# Patient Record
Sex: Female | Born: 1950 | Race: Black or African American | Hispanic: No | Marital: Married | Smoking: Never smoker
Health system: Southern US, Community
[De-identification: ages and names within clinical notes are randomized; demographics above are authoritative.]

## PROBLEM LIST (undated history)

## (undated) DIAGNOSIS — K5790 Diverticulosis of intestine, part unspecified, without perforation or abscess without bleeding: Secondary | ICD-10-CM

## (undated) DIAGNOSIS — M109 Gout, unspecified: Secondary | ICD-10-CM

## (undated) DIAGNOSIS — E119 Type 2 diabetes mellitus without complications: Secondary | ICD-10-CM

## (undated) DIAGNOSIS — K219 Gastro-esophageal reflux disease without esophagitis: Secondary | ICD-10-CM

## (undated) DIAGNOSIS — G43909 Migraine, unspecified, not intractable, without status migrainosus: Secondary | ICD-10-CM

## (undated) DIAGNOSIS — E559 Vitamin D deficiency, unspecified: Secondary | ICD-10-CM

## (undated) DIAGNOSIS — E7800 Pure hypercholesterolemia, unspecified: Secondary | ICD-10-CM

## (undated) DIAGNOSIS — J40 Bronchitis, not specified as acute or chronic: Secondary | ICD-10-CM

## (undated) DIAGNOSIS — I10 Essential (primary) hypertension: Secondary | ICD-10-CM

## (undated) HISTORY — PX: TUBAL LIGATION: SHX77

## (undated) HISTORY — PX: TRIGGER FINGER RELEASE: SHX641

---

## 1979-12-09 HISTORY — PX: TUBAL LIGATION: SHX77

## 2001-12-08 HISTORY — PX: COLONOSCOPY W/ POLYPECTOMY: SHX1380

## 2004-10-16 ENCOUNTER — Ambulatory Visit: Payer: Self-pay | Admitting: Family Medicine

## 2006-01-21 ENCOUNTER — Ambulatory Visit: Payer: Self-pay | Admitting: Family Medicine

## 2006-05-13 ENCOUNTER — Ambulatory Visit: Payer: Self-pay | Admitting: Family Medicine

## 2008-01-05 ENCOUNTER — Ambulatory Visit: Payer: Self-pay | Admitting: Family Medicine

## 2009-03-14 ENCOUNTER — Ambulatory Visit: Payer: Self-pay | Admitting: Family Medicine

## 2010-05-29 ENCOUNTER — Ambulatory Visit: Payer: Self-pay | Admitting: Family Medicine

## 2010-12-04 ENCOUNTER — Ambulatory Visit: Payer: Self-pay | Admitting: Gastroenterology

## 2012-11-30 ENCOUNTER — Ambulatory Visit: Payer: Self-pay | Admitting: Family Medicine

## 2012-12-03 ENCOUNTER — Ambulatory Visit: Payer: Self-pay | Admitting: Family Medicine

## 2014-12-08 HISTORY — PX: CATARACT EXTRACTION, BILATERAL: SHX1313

## 2015-06-22 ENCOUNTER — Encounter: Admission: RE | Payer: Self-pay | Source: Ambulatory Visit

## 2015-06-22 ENCOUNTER — Ambulatory Visit: Admission: RE | Admit: 2015-06-22 | Payer: Self-pay | Source: Ambulatory Visit | Admitting: Gastroenterology

## 2015-06-22 SURGERY — COLONOSCOPY WITH PROPOFOL
Anesthesia: General

## 2015-08-03 ENCOUNTER — Ambulatory Visit: Admission: RE | Admit: 2015-08-03 | Payer: 59 | Source: Ambulatory Visit | Admitting: Gastroenterology

## 2015-08-03 ENCOUNTER — Encounter: Admission: RE | Payer: Self-pay | Source: Ambulatory Visit

## 2015-08-03 SURGERY — COLONOSCOPY WITH PROPOFOL
Anesthesia: General

## 2015-09-13 ENCOUNTER — Encounter: Admission: RE | Payer: Self-pay | Source: Ambulatory Visit

## 2015-09-13 ENCOUNTER — Ambulatory Visit: Admission: RE | Admit: 2015-09-13 | Payer: 59 | Source: Ambulatory Visit | Admitting: Gastroenterology

## 2015-09-13 SURGERY — COLONOSCOPY WITH PROPOFOL
Anesthesia: General

## 2016-01-02 ENCOUNTER — Other Ambulatory Visit: Payer: Self-pay | Admitting: Physician Assistant

## 2016-01-02 DIAGNOSIS — Z1239 Encounter for other screening for malignant neoplasm of breast: Secondary | ICD-10-CM

## 2016-01-16 ENCOUNTER — Ambulatory Visit: Payer: 59

## 2016-01-17 ENCOUNTER — Ambulatory Visit
Admission: RE | Admit: 2016-01-17 | Discharge: 2016-01-17 | Disposition: A | Discharge status: Home / Self Care | Payer: 59 | Source: Ambulatory Visit | Attending: Physician Assistant | Admitting: Physician Assistant

## 2016-01-17 DIAGNOSIS — Z1231 Encounter for screening mammogram for malignant neoplasm of breast: Secondary | ICD-10-CM | POA: Insufficient documentation

## 2016-01-17 DIAGNOSIS — Z1239 Encounter for other screening for malignant neoplasm of breast: Secondary | ICD-10-CM

## 2017-01-10 ENCOUNTER — Encounter: Payer: Self-pay | Admitting: Emergency Medicine

## 2017-01-10 ENCOUNTER — Emergency Department: Payer: Medicare Other

## 2017-01-10 ENCOUNTER — Emergency Department
Admission: EM | Admit: 2017-01-10 | Discharge: 2017-01-11 | Disposition: A | Discharge status: Home / Self Care | Payer: Medicare Other | Attending: Emergency Medicine | Admitting: Emergency Medicine

## 2017-01-10 DIAGNOSIS — R55 Syncope and collapse: Secondary | ICD-10-CM | POA: Diagnosis present

## 2017-01-10 DIAGNOSIS — Z79899 Other long term (current) drug therapy: Secondary | ICD-10-CM | POA: Diagnosis not present

## 2017-01-10 DIAGNOSIS — F329 Major depressive disorder, single episode, unspecified: Secondary | ICD-10-CM | POA: Insufficient documentation

## 2017-01-10 DIAGNOSIS — F32A Depression, unspecified: Secondary | ICD-10-CM

## 2017-01-10 HISTORY — DX: Type 2 diabetes mellitus without complications: E11.9

## 2017-01-10 LAB — BASIC METABOLIC PANEL
ANION GAP: 13 (ref 5–15)
BUN: 13 mg/dL (ref 6–20)
CALCIUM: 9.6 mg/dL (ref 8.9–10.3)
CO2: 26 mmol/L (ref 22–32)
CREATININE: 1 mg/dL (ref 0.44–1.00)
Chloride: 95 mmol/L — ABNORMAL LOW (ref 101–111)
GFR calc Af Amer: >60 mL/min (ref >60)
GFR calc non Af Amer: 58 mL/min — ABNORMAL LOW (ref >60)
Glucose, Bld: 453 mg/dL — ABNORMAL HIGH (ref 65–99)
Potassium: 3.7 mmol/L (ref 3.5–5.1)
SODIUM: 134 mmol/L — AB (ref 135–145)

## 2017-01-10 LAB — URINE DRUG SCREEN, QUALITATIVE (ARMC ONLY)
Amphetamines, Ur Screen: NOT DETECTED
BARBITURATES, UR SCREEN: NOT DETECTED
Benzodiazepine, Ur Scrn: NOT DETECTED
CANNABINOID 50 NG, UR ~~LOC~~: NOT DETECTED
Cocaine Metabolite,Ur ~~LOC~~: NOT DETECTED
MDMA (Ecstasy)Ur Screen: NOT DETECTED
Methadone Scn, Ur: NOT DETECTED
Opiate, Ur Screen: NOT DETECTED
PHENCYCLIDINE (PCP) UR S: NOT DETECTED
TRICYCLIC, UR SCREEN: NOT DETECTED

## 2017-01-10 LAB — GLUCOSE, CAPILLARY
GLUCOSE-CAPILLARY: 313 mg/dL — AB (ref 65–99)
Glucose-Capillary: 315 mg/dL — ABNORMAL HIGH (ref 65–99)

## 2017-01-10 LAB — ACETAMINOPHEN LEVEL

## 2017-01-10 LAB — URINALYSIS, COMPLETE (UACMP) WITH MICROSCOPIC
Bilirubin Urine: NEGATIVE
HGB URINE DIPSTICK: NEGATIVE
Ketones, ur: 5 mg/dL — AB
Leukocytes, UA: NEGATIVE
NITRITE: NEGATIVE
Protein, ur: NEGATIVE mg/dL
SPECIFIC GRAVITY, URINE: 1.027 (ref 1.005–1.030)
pH: 6 (ref 5.0–8.0)

## 2017-01-10 LAB — CBC WITH DIFFERENTIAL/PLATELET
BASOS ABS: 0.1 10*3/uL (ref 0–0.1)
Basophils Relative: 1 %
EOS ABS: 0.1 10*3/uL (ref 0–0.7)
Eosinophils Relative: 1 %
HEMATOCRIT: 43.8 % (ref 35.0–47.0)
Hemoglobin: 14.5 g/dL (ref 12.0–16.0)
LYMPHS ABS: 0.9 10*3/uL — AB (ref 1.0–3.6)
LYMPHS PCT: 17 %
MCH: 27.8 pg (ref 26.0–34.0)
MCHC: 33.2 g/dL (ref 32.0–36.0)
MCV: 83.9 fL (ref 80.0–100.0)
MONO ABS: 0.4 10*3/uL (ref 0.2–0.9)
Monocytes Relative: 7 %
NEUTROS ABS: 3.6 10*3/uL (ref 1.4–6.5)
Neutrophils Relative %: 74 %
PLATELETS: 200 10*3/uL (ref 150–440)
RBC: 5.22 MIL/uL — ABNORMAL HIGH (ref 3.80–5.20)
RDW: 13.6 % (ref 11.5–14.5)
WBC: 5.1 10*3/uL (ref 3.6–11.0)

## 2017-01-10 LAB — SALICYLATE LEVEL

## 2017-01-10 MED ORDER — SODIUM CHLORIDE 0.9 % IV BOLUS (SEPSIS)
1000.0000 mL | Freq: Once | INTRAVENOUS | Status: AC
  Administered 2017-01-10: 1000 mL via INTRAVENOUS

## 2017-01-10 MED ORDER — SODIUM CHLORIDE 0.9 % IV BOLUS (SEPSIS)
1000.0000 mL | Freq: Once | INTRAVENOUS | Status: AC
  Administered 2017-01-10: 1000 mL via INTRAVENOUS
  Filled 2017-01-10: qty 1000

## 2017-01-10 MED ORDER — LORAZEPAM 2 MG/ML IJ SOLN
1.0000 mg | Freq: Once | INTRAMUSCULAR | Status: AC
  Administered 2017-01-10: 1 mg via INTRAVENOUS
  Filled 2017-01-10: qty 1

## 2017-01-10 MED ORDER — LORAZEPAM 2 MG/ML IJ SOLN
1.0000 mg | Freq: Four times a day (QID) | INTRAMUSCULAR | Status: DC
  Administered 2017-01-10: 1 mg via INTRAVENOUS
  Filled 2017-01-10: qty 1

## 2017-01-10 NOTE — ED Notes (Signed)
IVF complete CBG 313. Derrill KayGoodman, Md informed new orders received.
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
## 2017-01-10 NOTE — ED Notes (Signed)
Pt is still unresponsive in room. Pt at times moving legs and head back and forth but not responding verbally or opening her eyes at this time. Pt's family has left for home and assured that they will be contacted with any changes in care.

## 2017-01-11 DIAGNOSIS — F329 Major depressive disorder, single episode, unspecified: Secondary | ICD-10-CM | POA: Diagnosis not present

## 2017-01-11 MED ORDER — SODIUM CHLORIDE 0.9 % IV SOLN
Freq: Once | INTRAVENOUS | Status: AC
  Administered 2017-01-11: 01:00:00 via INTRAVENOUS

## 2017-01-11 MED ORDER — ACETAMINOPHEN 325 MG PO TABS
650.0000 mg | ORAL_TABLET | Freq: Once | ORAL | Status: AC
  Administered 2017-01-11: 650 mg via ORAL
  Filled 2017-01-11: qty 2

## 2017-01-11 MED ORDER — SERTRALINE HCL 25 MG PO TABS: 25.0000 mg | ORAL_TABLET | Freq: Every day | ORAL | 0 refills | Status: DC

## 2017-01-11 MED ORDER — LORAZEPAM 2 MG/ML IJ SOLN: 1.0000 mg | Freq: Four times a day (QID) | INTRAMUSCULAR | Status: DC | PRN

## 2017-01-11 NOTE — ED Notes (Addendum)
Pt pressed call bell, this nurse entered room, pt groaning and rubbing LLQ with hand, pt asked what's wrong, pt not answering, pt asked again, no answer.  Pt told to look at this nurse and open her eyes, pt followed command, pt asked if she's in pain, pt nodded, when pt asked where she hurts, pt motions to LLQ with hand.  Pt asked if she needs to use bathroom, pt nods no.  Pt asked to characterize pain, pt answers verbally "sharp".  Dr. York CeriseForbach informed, no new orders at this time.

## 2017-01-11 NOTE — ED Provider Notes (Signed)
Patient awaiting specialist on-call consult. Patient was previously in what appeared to be catatonic state but appears to be improving.   Physical Exam  BP (!) 154/79   Pulse 71   Temp 98.2 F (36.8 C) (Oral)   Resp 16   Ht 5\' 6"  (1.676 m)   Wt 200 lb (90.7 kg)   SpO2 95%   BMI 32.28 kg/m  ----------------------------------------- 10:25 AM on 01/11/2017 -----------------------------------------   Physical Exam Patient awake and alert this time. Conversant with me. Denies any suicidal or homicidal ideation. ED Course  Procedures  MDM Patient seen and evaluated by specialist on call psychiatrist who is recommending Zoloft 25 mg daily. I spoke to the cardiologist regarding her QTC who said that is likely artificially prolonged because of her right bundle-branch block and that she should be safe with this medication. I discussed this plan with the patient and she is understanding when to comply. She will be referred to [facility] for further psychiatric consultation as well as with her primary care doctor.       Myrna Blazeravid Matthew Schaevitz, MD 01/11/17 1026

## 2017-01-11 NOTE — ED Notes (Signed)
AAOx3.  Skin warm and dry. D/C home with daughter in law

## 2017-01-11 NOTE — ED Notes (Signed)
[facility]  Psych consult now via telepsych.

## 2017-02-20 HISTORY — PX: COLONOSCOPY: SHX174

## 2017-12-08 DIAGNOSIS — S92402A Displaced unspecified fracture of left great toe, initial encounter for closed fracture: Secondary | ICD-10-CM

## 2017-12-08 HISTORY — DX: Displaced unspecified fracture of left great toe, initial encounter for closed fracture: S92.402A

## 2018-03-22 ENCOUNTER — Other Ambulatory Visit: Payer: Self-pay | Admitting: Physician Assistant

## 2018-03-22 DIAGNOSIS — Z1239 Encounter for other screening for malignant neoplasm of breast: Secondary | ICD-10-CM

## 2018-03-22 DIAGNOSIS — Z780 Asymptomatic menopausal state: Secondary | ICD-10-CM

## 2018-05-26 ENCOUNTER — Inpatient Hospital Stay: Admission: RE | Admit: 2018-05-26 | Payer: PRIVATE HEALTH INSURANCE | Source: Ambulatory Visit

## 2018-06-02 ENCOUNTER — Ambulatory Visit
Admission: RE | Admit: 2018-06-02 | Discharge: 2018-06-02 | Disposition: A | Discharge status: Home / Self Care | Payer: Medicare Other | Source: Ambulatory Visit | Attending: Physician Assistant | Admitting: Physician Assistant

## 2018-06-02 DIAGNOSIS — Z1239 Encounter for other screening for malignant neoplasm of breast: Secondary | ICD-10-CM | POA: Diagnosis present

## 2018-06-02 DIAGNOSIS — Z1231 Encounter for screening mammogram for malignant neoplasm of breast: Secondary | ICD-10-CM | POA: Diagnosis not present

## 2018-06-02 DIAGNOSIS — Z780 Asymptomatic menopausal state: Secondary | ICD-10-CM | POA: Diagnosis present

## 2018-08-14 ENCOUNTER — Emergency Department: Payer: Medicare Other

## 2018-08-14 ENCOUNTER — Encounter: Payer: Self-pay | Admitting: Emergency Medicine

## 2018-08-14 DIAGNOSIS — W108XXA Fall (on) (from) other stairs and steps, initial encounter: Secondary | ICD-10-CM | POA: Insufficient documentation

## 2018-08-14 DIAGNOSIS — E119 Type 2 diabetes mellitus without complications: Secondary | ICD-10-CM | POA: Diagnosis not present

## 2018-08-14 DIAGNOSIS — Z79899 Other long term (current) drug therapy: Secondary | ICD-10-CM | POA: Insufficient documentation

## 2018-08-14 DIAGNOSIS — S93492A Sprain of other ligament of left ankle, initial encounter: Secondary | ICD-10-CM | POA: Insufficient documentation

## 2018-08-14 DIAGNOSIS — Y999 Unspecified external cause status: Secondary | ICD-10-CM | POA: Diagnosis not present

## 2018-08-14 DIAGNOSIS — Z794 Long term (current) use of insulin: Secondary | ICD-10-CM | POA: Diagnosis not present

## 2018-08-14 DIAGNOSIS — S92415A Nondisplaced fracture of proximal phalanx of left great toe, initial encounter for closed fracture: Secondary | ICD-10-CM | POA: Diagnosis not present

## 2018-08-14 DIAGNOSIS — Y9389 Activity, other specified: Secondary | ICD-10-CM | POA: Insufficient documentation

## 2018-08-14 DIAGNOSIS — Y92511 Restaurant or cafe as the place of occurrence of the external cause: Secondary | ICD-10-CM | POA: Insufficient documentation

## 2018-08-14 DIAGNOSIS — S90932A Unspecified superficial injury of left great toe, initial encounter: Secondary | ICD-10-CM | POA: Diagnosis present

## 2018-08-14 NOTE — ED Notes (Signed)
Patient to waiting room via EMS after a fall.  Per EMS patient complains of left ankle pain.  VS:  HR 94; BP 189/91; RR 20, pulse oxi 97% on room air; cbg 512.
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
## 2018-08-15 ENCOUNTER — Emergency Department
Admission: EM | Admit: 2018-08-15 | Discharge: 2018-08-15 | Disposition: A | Discharge status: Home / Self Care | Payer: Medicare Other | Attending: Emergency Medicine | Admitting: Emergency Medicine

## 2018-08-15 DIAGNOSIS — S92415A Nondisplaced fracture of proximal phalanx of left great toe, initial encounter for closed fracture: Secondary | ICD-10-CM

## 2018-08-15 DIAGNOSIS — S93492A Sprain of other ligament of left ankle, initial encounter: Secondary | ICD-10-CM

## 2018-08-15 MED ORDER — IBUPROFEN 600 MG PO TABS
600.0000 mg | ORAL_TABLET | Freq: Once | ORAL | Status: AC
  Administered 2018-08-15: 600 mg via ORAL

## 2018-08-15 MED ORDER — IBUPROFEN 600 MG PO TABS
ORAL_TABLET | ORAL | Status: AC
  Filled 2018-08-15: qty 1

## 2018-08-15 MED ORDER — DICLOFENAC SODIUM 1 % TD GEL: 4.0000 g | Freq: Four times a day (QID) | TRANSDERMAL | 0 refills | Status: DC | PRN
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
## 2018-08-15 NOTE — Discharge Instructions (Addendum)
Please wear hard soled shoes at all times as well as ankle support to help with your broken toe as well as your sprained ankle.  Follow-up with the podiatrist within 1 week for recheck and return to the emergency department sooner for any concerns.  It was a pleasure to take care of you today, and thank you for coming to our emergency department.  If you have any questions or concerns before leaving please ask the nurse to grab me and I'm more than happy to go through your aftercare instructions again.  If you were prescribed any opioid pain medication today such as Norco, Vicodin, Percocet, morphine, hydrocodone, or oxycodone please make sure you do not drive when you are taking this medication as it can alter your ability to drive safely.  If you have any concerns once you are home that you are not improving or are in fact getting worse before you can make it to your follow-up appointment, please do not hesitate to call 911 and come back for further evaluation.  Merrily Brittle, MD  Results for orders placed or performed during the hospital encounter of 01/10/17  CBC with Differential  Result Value Ref Range   WBC 5.1 3.6 - 11.0 K/uL   RBC 5.22 (H) 3.80 - 5.20 MIL/uL   Hemoglobin 14.5 12.0 - 16.0 g/dL   HCT 16.1 09.6 - 04.5 %   MCV 83.9 80.0 - 100.0 fL   MCH 27.8 26.0 - 34.0 pg   MCHC 33.2 32.0 - 36.0 g/dL   RDW 40.9 81.1 - 91.4 %   Platelets 200 150 - 440 K/uL   Neutrophils Relative % 74 %   Lymphocytes Relative 17 %   Monocytes Relative 7 %   Eosinophils Relative 1 %   Basophils Relative 1 %   Neutro Abs 3.6 1.4 - 6.5 K/uL   Lymphs Abs 0.9 (L) 1.0 - 3.6 K/uL   Monocytes Absolute 0.4 0.2 - 0.9 K/uL   Eosinophils Absolute 0.1 0 - 0.7 K/uL   Basophils Absolute 0.1 0 - 0.1 K/uL  Basic metabolic panel  Result Value Ref Range   Sodium 134 (L) 135 - 145 mmol/L   Potassium 3.7 3.5 - 5.1 mmol/L   Chloride 95 (L) 101 - 111 mmol/L   CO2 26 22 - 32 mmol/L   Glucose, Bld 453 (H) 65 - 99  mg/dL   BUN 13 6 - 20 mg/dL   Creatinine, Ser 7.82 0.44 - 1.00 mg/dL   Calcium 9.6 8.9 - 95.6 mg/dL   GFR calc non Af Amer 58 (L) >60 mL/min   GFR calc Af Amer >60 >60 mL/min   Anion gap 13 5 - 15  Acetaminophen level  Result Value Ref Range   Acetaminophen (Tylenol), Serum <10 (L) 10 - 30 ug/mL  Salicylate level  Result Value Ref Range   Salicylate Lvl <7.0 2.8 - 30.0 mg/dL  Urine Drug Screen, Qualitative ([facility] only)  Result Value Ref Range   Tricyclic, Ur Screen NONE DETECTED NONE DETECTED   Amphetamines, Ur Screen NONE DETECTED NONE DETECTED   MDMA (Ecstasy)Ur Screen NONE DETECTED NONE DETECTED   Cocaine Metabolite,Ur [place] NONE DETECTED NONE DETECTED   Opiate, Ur Screen NONE DETECTED NONE DETECTED   Phencyclidine (PCP) Ur S NONE DETECTED NONE DETECTED   Cannabinoid 50 Ng, Ur [place] NONE DETECTED NONE DETECTED   Barbiturates, Ur Screen NONE DETECTED NONE DETECTED   Benzodiazepine, Ur Scrn NONE DETECTED NONE DETECTED   Methadone Scn, Ur NONE DETECTED  NONE DETECTED  Urinalysis, Complete w Microscopic  Result Value Ref Range   Color, Urine YELLOW (A) YELLOW   APPearance HAZY (A) CLEAR   Specific Gravity, Urine 1.027 1.005 - 1.030   pH 6.0 5.0 - 8.0   Glucose, UA >=500 (A) NEGATIVE mg/dL   Hgb urine dipstick NEGATIVE NEGATIVE   Bilirubin Urine NEGATIVE NEGATIVE   Ketones, ur 5 (A) NEGATIVE mg/dL   Protein, ur NEGATIVE NEGATIVE mg/dL   Nitrite NEGATIVE NEGATIVE   Leukocytes, UA NEGATIVE NEGATIVE   RBC / HPF 0-5 0 - 5 RBC/hpf   WBC, UA 0-5 0 - 5 WBC/hpf   Bacteria, UA FEW (A) NONE SEEN   Squamous Epithelial / LPF 0-5 (A) NONE SEEN   Mucus PRESENT   Glucose, capillary  Result Value Ref Range   Glucose-Capillary 313 (H) 65 - 99 mg/dL  Glucose, capillary  Result Value Ref Range   Glucose-Capillary 315 (H) 65 - 99 mg/dL   Dg Tibia/fibula Left  Result Date: 08/14/2018 CLINICAL DATA:  Pain after trip and fall injury today. EXAM: LEFT TIBIA AND FIBULA - 2 VIEW COMPARISON:  None.  FINDINGS: There is no evidence of fracture or other focal bone lesions. Soft tissues are unremarkable. IMPRESSION: Negative. Electronically Signed   By: Burman Nieves M.D.   On: 08/14/2018 23:48   Dg Foot Complete Left  Result Date: 08/14/2018 CLINICAL DATA:  Pain after trip and fall injury today. EXAM: LEFT FOOT - COMPLETE 3+ VIEW COMPARISON:  None. FINDINGS: Hallux valgus deformity with degenerative changes in the first metatarsal-phalangeal joint. Probably comminuted mostly transverse fracture of the midshaft of the first proximal phalanx. No articular involvement is suggested. No other fractures or dislocation identified. Soft tissues are unremarkable. 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 Comminuted fracture of the midshaft of the first proximal phalanx. Electronically Signed   By: Burman Nieves M.D.   On: 08/14/2018 23:49

## 2018-09-23 ENCOUNTER — Other Ambulatory Visit: Payer: Self-pay

## 2018-09-23 ENCOUNTER — Inpatient Hospital Stay
Admission: AD | Admit: 2018-09-23 | Discharge: 2018-09-25 | DRG: 256 | Disposition: A | Discharge status: Home / Self Care | Payer: Medicare Other | Source: Ambulatory Visit | Attending: Internal Medicine | Admitting: Internal Medicine

## 2018-09-23 DIAGNOSIS — Z791 Long term (current) use of non-steroidal anti-inflammatories (NSAID): Secondary | ICD-10-CM

## 2018-09-23 DIAGNOSIS — D6489 Other specified anemias: Secondary | ICD-10-CM | POA: Diagnosis present

## 2018-09-23 DIAGNOSIS — Z794 Long term (current) use of insulin: Secondary | ICD-10-CM | POA: Diagnosis not present

## 2018-09-23 DIAGNOSIS — L97529 Non-pressure chronic ulcer of other part of left foot with unspecified severity: Secondary | ICD-10-CM | POA: Diagnosis present

## 2018-09-23 DIAGNOSIS — K219 Gastro-esophageal reflux disease without esophagitis: Secondary | ICD-10-CM | POA: Diagnosis present

## 2018-09-23 DIAGNOSIS — Z79899 Other long term (current) drug therapy: Secondary | ICD-10-CM | POA: Diagnosis not present

## 2018-09-23 DIAGNOSIS — E11621 Type 2 diabetes mellitus with foot ulcer: Secondary | ICD-10-CM | POA: Diagnosis present

## 2018-09-23 DIAGNOSIS — Z7982 Long term (current) use of aspirin: Secondary | ICD-10-CM | POA: Diagnosis not present

## 2018-09-23 DIAGNOSIS — I10 Essential (primary) hypertension: Secondary | ICD-10-CM | POA: Diagnosis present

## 2018-09-23 DIAGNOSIS — I96 Gangrene, not elsewhere classified: Secondary | ICD-10-CM | POA: Diagnosis present

## 2018-09-23 DIAGNOSIS — E1140 Type 2 diabetes mellitus with diabetic neuropathy, unspecified: Secondary | ICD-10-CM | POA: Diagnosis present

## 2018-09-23 DIAGNOSIS — E1152 Type 2 diabetes mellitus with diabetic peripheral angiopathy with gangrene: Principal | ICD-10-CM | POA: Diagnosis present

## 2018-09-23 DIAGNOSIS — E10621 Type 1 diabetes mellitus with foot ulcer: Secondary | ICD-10-CM | POA: Diagnosis not present

## 2018-09-23 DIAGNOSIS — L97509 Non-pressure chronic ulcer of other part of unspecified foot with unspecified severity: Secondary | ICD-10-CM

## 2018-09-23 LAB — CBC
HEMATOCRIT: 40.3 % (ref 36.0–46.0)
HEMOGLOBIN: 12.7 g/dL (ref 12.0–15.0)
MCH: 26.8 pg (ref 26.0–34.0)
MCHC: 31.5 g/dL (ref 30.0–36.0)
MCV: 85 fL (ref 80.0–100.0)
Platelets: 319 10*3/uL (ref 150–400)
RBC: 4.74 MIL/uL (ref 3.87–5.11)
RDW: 13.3 % (ref 11.5–15.5)
WBC: 4.4 10*3/uL (ref 4.0–10.5)
nRBC: 0 % (ref 0.0–0.2)

## 2018-09-23 LAB — BASIC METABOLIC PANEL
Anion gap: 6 (ref 5–15)
BUN: 15 mg/dL (ref 8–23)
CALCIUM: 9.3 mg/dL (ref 8.9–10.3)
CO2: 28 mmol/L (ref 22–32)
CREATININE: 0.93 mg/dL (ref 0.44–1.00)
Chloride: 103 mmol/L (ref 98–111)
GFR calc Af Amer: >60 mL/min (ref >60)
Glucose, Bld: 114 mg/dL — ABNORMAL HIGH (ref 70–99)
POTASSIUM: 4.1 mmol/L (ref 3.5–5.1)
Sodium: 137 mmol/L (ref 135–145)

## 2018-09-23 LAB — GLUCOSE, CAPILLARY
GLUCOSE-CAPILLARY: 295 mg/dL — AB (ref 70–99)
Glucose-Capillary: 109 mg/dL — ABNORMAL HIGH (ref 70–99)
Glucose-Capillary: 159 mg/dL — ABNORMAL HIGH (ref 70–99)
Glucose-Capillary: 207 mg/dL — ABNORMAL HIGH (ref 70–99)

## 2018-09-23 LAB — PROTIME-INR
INR: 1.04
PROTHROMBIN TIME: 13.5 s (ref 11.4–15.2)

## 2018-09-23 LAB — SURGICAL PCR SCREEN
MRSA, PCR: NEGATIVE
Staphylococcus aureus: NEGATIVE

## 2018-09-23 MED ORDER — ATENOLOL 25 MG PO TABS
25.0000 mg | ORAL_TABLET | Freq: Every day | ORAL | Status: DC
  Administered 2018-09-23 – 2018-09-24 (×2): 25 mg via ORAL
  Filled 2018-09-23 (×2): qty 1

## 2018-09-23 MED ORDER — CHLORHEXIDINE GLUCONATE 4 % EX LIQD: 60.0000 mL | Freq: Once | CUTANEOUS | Status: DC

## 2018-09-23 MED ORDER — INSULIN ASPART 100 UNIT/ML ~~LOC~~ SOLN
0.0000 [IU] | Freq: Four times a day (QID) | SUBCUTANEOUS | Status: DC
  Administered 2018-09-24: 3 [IU] via SUBCUTANEOUS
  Administered 2018-09-24: 5 [IU] via SUBCUTANEOUS
  Administered 2018-09-24: 3 [IU] via SUBCUTANEOUS
  Administered 2018-09-25: 2 [IU] via SUBCUTANEOUS
  Administered 2018-09-25: 3 [IU] via SUBCUTANEOUS
  Administered 2018-09-25: 2 [IU] via SUBCUTANEOUS
  Filled 2018-09-23 (×7): qty 1

## 2018-09-23 MED ORDER — ATENOLOL 25 MG PO TABS: 25.0000 mg | ORAL_TABLET | Freq: Every day | ORAL | Status: DC

## 2018-09-23 MED ORDER — PANTOPRAZOLE SODIUM 40 MG PO TBEC
40.0000 mg | DELAYED_RELEASE_TABLET | Freq: Every day | ORAL | Status: DC
  Administered 2018-09-24 – 2018-09-25 (×2): 40 mg via ORAL
  Filled 2018-09-23 (×2): qty 1

## 2018-09-23 MED ORDER — ACETAMINOPHEN 650 MG RE SUPP: 650.0000 mg | Freq: Four times a day (QID) | RECTAL | Status: DC | PRN

## 2018-09-23 MED ORDER — CEFAZOLIN SODIUM-DEXTROSE 2-4 GM/100ML-% IV SOLN
2.0000 g | INTRAVENOUS | Status: AC
  Administered 2018-09-24: 2 g via INTRAVENOUS
  Filled 2018-09-23: qty 100

## 2018-09-23 MED ORDER — FERROUS SULFATE 325 (65 FE) MG PO TABS
325.0000 mg | ORAL_TABLET | Freq: Every day | ORAL | Status: DC
  Administered 2018-09-24 – 2018-09-25 (×2): 325 mg via ORAL
  Filled 2018-09-23 (×2): qty 1

## 2018-09-23 MED ORDER — AMLODIPINE BESYLATE 10 MG PO TABS
10.0000 mg | ORAL_TABLET | Freq: Every day | ORAL | Status: DC
  Administered 2018-09-23 – 2018-09-24 (×2): 10 mg via ORAL
  Filled 2018-09-23 (×2): qty 1

## 2018-09-23 MED ORDER — HYDROCHLOROTHIAZIDE 25 MG PO TABS: 25.0000 mg | ORAL_TABLET | Freq: Every day | ORAL | Status: DC

## 2018-09-23 MED ORDER — ATENOLOL 25 MG PO TABS
25.0000 mg | ORAL_TABLET | Freq: Every day | ORAL | Status: DC
Start: 2018-09-24 — End: 2018-09-23

## 2018-09-23 MED ORDER — SODIUM CHLORIDE 0.9 % IV SOLN
INTRAVENOUS | Status: DC
  Administered 2018-09-23 – 2018-09-24 (×3): via INTRAVENOUS

## 2018-09-23 MED ORDER — LOSARTAN POTASSIUM-HCTZ 100-25 MG PO TABS: 1.0000 | ORAL_TABLET | Freq: Every day | ORAL | Status: DC

## 2018-09-23 MED ORDER — ACETAMINOPHEN 325 MG PO TABS: 650.0000 mg | ORAL_TABLET | Freq: Four times a day (QID) | ORAL | Status: DC | PRN

## 2018-09-23 MED ORDER — LOSARTAN POTASSIUM 50 MG PO TABS
100.0000 mg | ORAL_TABLET | Freq: Every day | ORAL | Status: DC
  Administered 2018-09-23 – 2018-09-24 (×2): 100 mg via ORAL
  Filled 2018-09-23 (×2): qty 2

## 2018-09-23 MED ORDER — ENOXAPARIN SODIUM 40 MG/0.4ML ~~LOC~~ SOLN: 40.0000 mg | SUBCUTANEOUS | Status: DC

## 2018-09-23 MED ORDER — POLYETHYLENE GLYCOL 3350 17 G PO PACK: 17.0000 g | PACK | Freq: Every day | ORAL | Status: DC | PRN

## 2018-09-23 MED ORDER — SODIUM CHLORIDE 0.9 % IV SOLN: INTRAVENOUS | Status: DC

## 2018-09-23 MED ORDER — INSULIN GLARGINE 100 UNIT/ML ~~LOC~~ SOLN
55.0000 [IU] | Freq: Two times a day (BID) | SUBCUTANEOUS | Status: DC
  Administered 2018-09-23 – 2018-09-25 (×3): 55 [IU] via SUBCUTANEOUS
  Filled 2018-09-23 (×6): qty 0.55

## 2018-09-23 MED ORDER — HYDROCHLOROTHIAZIDE 25 MG PO TABS
25.0000 mg | ORAL_TABLET | Freq: Every day | ORAL | Status: DC
  Administered 2018-09-23 – 2018-09-25 (×2): 25 mg via ORAL
  Filled 2018-09-23 (×2): qty 1

## 2018-09-23 MED ORDER — TRAMADOL HCL 50 MG PO TABS: 50.0000 mg | ORAL_TABLET | Freq: Four times a day (QID) | ORAL | Status: DC | PRN

## 2018-09-23 MED ORDER — MUPIROCIN 2 % EX OINT
1.0000 "application " | TOPICAL_OINTMENT | Freq: Two times a day (BID) | CUTANEOUS | Status: DC
  Filled 2018-09-23: qty 22

## 2018-09-23 MED ORDER — SULFAMETHOXAZOLE-TRIMETHOPRIM 800-160 MG PO TABS
1.0000 | ORAL_TABLET | Freq: Two times a day (BID) | ORAL | Status: DC
  Administered 2018-09-23 – 2018-09-25 (×4): 1 via ORAL
  Filled 2018-09-23 (×4): qty 1

## 2018-09-23 MED ORDER — ENOXAPARIN SODIUM 40 MG/0.4ML ~~LOC~~ SOLN
40.0000 mg | SUBCUTANEOUS | Status: DC
  Administered 2018-09-24: 40 mg via SUBCUTANEOUS
  Filled 2018-09-23: qty 0.4

## 2018-09-23 MED ORDER — POVIDONE-IODINE 10 % EX SWAB: 2.0000 "application " | Freq: Once | CUTANEOUS | Status: DC

## 2018-09-23 MED ORDER — AMLODIPINE BESYLATE 10 MG PO TABS
10.0000 mg | ORAL_TABLET | Freq: Every day | ORAL | Status: DC
Start: 2018-09-24 — End: 2018-09-23

## 2018-09-23 MED ORDER — LOSARTAN POTASSIUM 50 MG PO TABS: 100.0000 mg | ORAL_TABLET | Freq: Every day | ORAL | Status: DC

## 2018-09-23 MED ORDER — AMLODIPINE BESYLATE 10 MG PO TABS: 10.0000 mg | ORAL_TABLET | Freq: Every day | ORAL | Status: DC

## 2018-09-23 NOTE — Anesthesia Preprocedure Evaluation (Addendum)
Anesthesia Evaluation  Patient identified by MRN, date of birth, ID band Patient awake    Reviewed: Allergy & Precautions, NPO status , Patient's Chart, lab work & pertinent test results, reviewed documented beta blocker date and time   History of Anesthesia Complications Negative for: history of anesthetic complications  Airway Mallampati: II  TM Distance: >3 FB Neck ROM: Full    Dental no notable dental hx.    Pulmonary neg pulmonary ROS, neg sleep apnea, neg COPD,    breath sounds clear to auscultation- rhonchi (-) wheezing      Cardiovascular Exercise Tolerance: Good hypertension, Pt. on medications and Pt. on home beta blockers (-) CAD, (-) Past MI, (-) Cardiac Stents and (-) CABG  Rhythm:Regular Rate:Normal - Systolic murmurs and - Diastolic murmurs    Neuro/Psych negative neurological ROS  negative psych ROS   GI/Hepatic Neg liver ROS, GERD  Medicated and Controlled,  Endo/Other  diabetes, Insulin Dependent  Renal/GU negative Renal ROS  negative genitourinary   Musculoskeletal negative musculoskeletal ROS (+)   Abdominal (+) + obese,   Peds negative pediatric ROS (+)  Hematology negative hematology ROS (+)   Anesthesia Other Findings Past Medical History: No date: Diabetes mellitus without complication (HCC)   Reproductive/Obstetrics                            Anesthesia Physical Anesthesia Plan  ASA: II  Anesthesia Plan: General   Post-op Pain Management:    Induction: Intravenous  PONV Risk Score and Plan: 2 and Propofol infusion  Airway Management Planned: Natural Airway  Additional Equipment:   Intra-op Plan:   Post-operative Plan:   Informed Consent: I have reviewed the patients History and Physical, chart, labs and discussed the procedure including the risks, benefits and alternatives for the proposed anesthesia with the patient or authorized representative who  has indicated his/her understanding and acceptance.   Dental advisory given  Plan Discussed with: CRNA and Anesthesiologist  Anesthesia Plan Comments:        Anesthesia Quick Evaluation

## 2018-09-23 NOTE — Progress Notes (Signed)
ORTHOPAEDIC CONSULTATION  REQUESTING PHYSICIAN: Enedina Finner, MD  Chief Complaint: Left second toe necrosis.  HPI: Carrie Burns is a 67 y.o. female who complains of open wound on the left second toe.  Patient sustained a fracture of her great toe and had been buddy taping the second and great toe together.  Developed a wound on the lateral aspect of the second toe that has become a full-thickness ulceration with exposure of the joint.  There has been drainage from the wound in the outpatient clinic.  Seen by Dr. Alberteen Spindle yesterday and had discussed the possibility of surgical intervention.  At this point she is admitted with necrosis of the left second toe with a history of diabetes.  She is scheduled for surgery tomorrow for amputation.  Past Medical History:  Diagnosis Date  . Diabetes mellitus without complication (HCC)    History reviewed. No pertinent surgical history. Social History   Socioeconomic History  . Marital status: Married    Spouse name: Not on file  . Number of children: Not on file  . Years of education: Not on file  . Highest education level: Not on file  Occupational History  . Not on file  Social Needs  . Financial resource strain: Not very hard  . Food insecurity:    Worry: Never true    Inability: Never true  . Transportation needs:    Medical: No    Non-medical: No  Tobacco Use  . Smoking status: Never Smoker  . Smokeless tobacco: Never Used  Substance and Sexual Activity  . Alcohol use: Never    Frequency: Never  . Drug use: Never  . Sexual activity: Not on file  Lifestyle  . Physical activity:    Days per week: 3 days    Minutes per session: 30 min  . Stress: Only a little  Relationships  . Social connections:    Talks on phone: More than three times a week    Gets together: More than three times a week    Attends religious service: 1 to 4 times per year    Active member of club or organization: Yes    Attends meetings of clubs or  organizations: 1 to 4 times per year    Relationship status: Not on file  Other Topics Concern  . Not on file  Social History Narrative  . Not on file   Family History  Problem Relation Age of Onset  . Breast cancer Neg Hx    No Known Allergies Prior to Admission medications   Medication Sig Start Date End Date Taking? Authorizing Provider  amLODipine (NORVASC) 10 MG tablet Take 10 mg by mouth daily.   Yes [provider]  aspirin EC 81 MG tablet Take 81 mg by mouth daily.   Yes [provider]  atenolol (TENORMIN) 25 MG tablet Take 25 mg by mouth daily.   Yes [provider]  diclofenac sodium (VOLTAREN) 1 % GEL Apply 4 g topically 4 (four) times daily as needed (pain). 08/15/18  Yes Merrily Brittle, MD  docusate sodium (COLACE) 100 MG capsule Take 100 mg by mouth 2 (two) times daily.   Yes [provider]  Exenatide ER (BYDUREON BCISE) 2 MG/0.85ML AUIJ Inject 2 mg into the skin once a week.    Yes [provider]  ferrous sulfate 325 (65 FE) MG tablet Take 325 mg by mouth daily.   Yes [provider]  glipiZIDE (GLUCOTROL) 5 MG tablet Take 5 mg  by mouth 2 (two) times daily before a meal.    Yes [provider]  hydrochlorothiazide (HYDRODIURIL) 50 MG tablet Take 50 mg by mouth daily.   Yes [provider]  ibuprofen (ADVIL,MOTRIN) 200 MG tablet Take 200 mg by mouth every 6 (six) hours as needed.   Yes [provider]  insulin glargine (LANTUS) 100 UNIT/ML injection Inject 55 Units into the skin 2 (two) times daily.   Yes [provider]  losartan-hydrochlorothiazide (HYZAAR) 100-25 MG tablet Take 1 tablet by mouth daily.   Yes [provider]  pantoprazole (PROTONIX) 40 MG tablet Take 40 mg by mouth daily.   Yes [provider]  sertraline (ZOLOFT) 25 MG tablet Take 1 tablet (25 mg total) by mouth daily. 01/11/17 01/11/18  Schaevitz, Myra Rude, MD  vitamin C (ASCORBIC ACID) 500 MG  tablet Take 500 mg by mouth daily.    [provider]   No results found.  Positive ROS: All other systems have been reviewed and were otherwise negative with the exception of those mentioned in the HPI and as above.  12 point ROS was performed.  Physical Exam: General: Alert and oriented.  No apparent distress.  Vascular:  Left foot:Dorsalis Pedis:  present Posterior Tibial:  diminished  Right foot: Dorsalis Pedis:  present Posterior Tibial:  diminished  Neuro:absent  Derm: Full-thickness necrotic wound on the lateral aspect of left second toe.  There is noted exposure of the PIPJ with a scant amount of serous and purulent drainage.  No proximal lymphangitic streaking.  Ortho/MS: Instability noted with range of motion at the PIPJ of the left second toe.  She does have some mild edema to the left ankle.  Assessment: Necrotic wound to joint with exposed joint left second toe Diabetes with neuropathy  Plan: We had a discussion in regards to outcomes for attempted salvage of this left second toe.  I discussed there is very minimal that can be done with the exposed joint and necrotic tissue on the lateral aspect of the second toe.  I do believe she has an excellent chance of recovery given her strong pulse with her dorsalis pedis and palpable posterior tibial pulse at this time.  I believe this was tissue necrosis from the pressure bandage that had been applied but with the amount of exposure of the joint and bone her best option is amputation.  After discussing with her and her family she has elected undergo amputation of the left second toe.  We will plan to perform this tomorrow.  Suspect amputation will be proximal at the level of the MTPJ.  Hopefully can primarily closed and likely discharge in 1 to 2 days after surgery.  Vascular surgery has been consulted and they noted if we have strong bleeding good and good perfusion likely no further intervention would be needed.  If the  perfusion is poor we can consider angios with possible intervention if needed.    Irean Hong, DPM Cell [phone]   09/23/2018 5:47 PM

## 2018-09-23 NOTE — Progress Notes (Signed)
Family Meeting Note  Advance Directive:yes Today a meeting took place with the pateint No family  Patient admitted for left second toe necrosis. Plans for amputation noted she is diabetic hypertensive. Otherwise independent at home. Code status discussed patient wants to be full code. Time spent during discussion 17 mins Carrie Finner, MD

## 2018-09-23 NOTE — Consult Note (Signed)
CH attempted to deliver HCPOA information ; Patient asleep CH will attempt later

## 2018-09-23 NOTE — H&P (Signed)
[place] [facility] [place] NAME: Carrie Burns    MR#:  [id number]  DATE OF BIRTH:  1951/01/03  DATE OF ADMISSION:  09/23/2018  PRIMARY CARE PHYSICIAN: Cristy Folks, PA-C   REQUESTING/REFERRING PHYSICIAN: Dr Cleda Mccreedy  CHIEF COMPLAINT:  left second toe infection/necrosis  HISTORY OF PRESENT ILLNESS:  Carrie Burns  is a 67 y.o. female with a known history of diabetes on insulin, hypertension is admitted from podiatry office with worsening left second toe infection/necrosis patient currently is on oral antibiotic with Bactrim which she is taking for last seven days. Dr. Cleda Mccreedy plans to do amputation of left second toe. Vascular consultation placed with Dr. Delana Meyer.  PAST MEDICAL HISTORY:   Past Medical History:  Diagnosis Date  . Diabetes mellitus without complication ([place])     PAST SURGICAL HISTOIRY:  History reviewed. No pertinent surgical history.  SOCIAL HISTORY:   Social History   Tobacco Use  . Smoking status: Never Smoker  . Smokeless tobacco: Never Used  Substance Use Topics  . Alcohol use: Never    Frequency: Never    FAMILY HISTORY:   Family History  Problem Relation Age of Onset  . Breast cancer Neg Hx     DRUG ALLERGIES:  No Known Allergies  REVIEW OF SYSTEMS:  Review of Systems  Constitutional: Negative for chills, fever and weight loss.  HENT: Negative for ear discharge, ear pain and nosebleeds.   Eyes: Negative for blurred vision, pain and discharge.  Respiratory: Negative for sputum production, shortness of breath, wheezing and stridor.   Cardiovascular: Negative for chest pain, palpitations, orthopnea and PND.  Gastrointestinal: Negative for abdominal pain, diarrhea, nausea and vomiting.  Genitourinary: Negative for frequency and urgency.  Musculoskeletal: Negative for back pain and joint pain.  Neurological: Negative for sensory change, speech change, focal weakness and weakness.   Psychiatric/Behavioral: Negative for depression and hallucinations. The patient is not nervous/anxious.      MEDICATIONS AT HOME:   Prior to Admission medications   Medication Sig Start Date End Date Taking? Authorizing Provider  amLODipine (NORVASC) 10 MG tablet Take 10 mg by mouth daily.    [provider]  atenolol (TENORMIN) 25 MG tablet Take 25 mg by mouth daily.    [provider]  diclofenac sodium (VOLTAREN) 1 % GEL Apply 4 g topically 4 (four) times daily as needed (pain). 08/15/18   Darel Hong, MD  docusate sodium (COLACE) 100 MG capsule Take 100 mg by mouth 2 (two) times daily.    [provider]  ferrous sulfate 325 (65 FE) MG tablet Take 325 mg by mouth daily.    [provider]  ibuprofen (ADVIL,MOTRIN) 200 MG tablet Take 200 mg by mouth every 6 (six) hours as needed.    [provider]  insulin glargine (LANTUS) 100 UNIT/ML injection Inject 55 Units into the skin 2 (two) times daily.    [provider]  losartan-hydrochlorothiazide (HYZAAR) 100-25 MG tablet Take 1 tablet by mouth daily.    [provider]  pantoprazole (PROTONIX) 40 MG tablet Take 40 mg by mouth daily.    [provider]  sertraline (ZOLOFT) 25 MG tablet Take 1 tablet (25 mg total) by mouth daily. 01/11/17 01/11/18  Schaevitz, Randall An, MD  vitamin C (ASCORBIC ACID) 500 MG tablet Take 500 mg by mouth daily.    [provider]      VITAL SIGNS:  Blood pressure 125/79, pulse 69, temperature 98.5  F (36.9 C), temperature source Oral, resp. rate 16, SpO2 100 %.  PHYSICAL EXAMINATION:  GENERAL:  67 y.o.-year-old patient lying in the bed with no acute distress.  EYES: Pupils equal, round, reactive to light and accommodation. No scleral icterus. Extraocular muscles intact.  HEENT: Head atraumatic, normocephalic. Oropharynx and nasopharynx clear.  NECK:  Supple, no jugular venous distention. No thyroid enlargement, no  tenderness.  LUNGS: Normal breath sounds bilaterally, no wheezing, rales,rhonchi or crepitation. No use of accessory muscles of respiration.  CARDIOVASCULAR: S1, S2 normal. No murmurs, rubs, or gallops.  ABDOMEN: Soft, nontender, nondistended. Bowel sounds present. No organomegaly or mass.  EXTREMITIES: left second toe necrosis. NEUROLOGIC: Cranial nerves II through XII are intact. Muscle strength 5/5 in all extremities. Sensation intact. Gait not checked.  PSYCHIATRIC: The patient is alert and oriented x 3.  SKIN: No obvious rash, lesion, or ulcer.   LABORATORY PANEL:   CBC No results for input(s): WBC, HGB, HCT, PLT in the last 168 hours. ------------------------------------------------------------------------------------------------------------------  Chemistries  No results for input(s): NA, K, CL, CO2, GLUCOSE, BUN, CREATININE, CALCIUM, MG, AST, ALT, ALKPHOS, BILITOT in the last 168 hours.  Invalid input(s): GFRCGP ------------------------------------------------------------------------------------------------------------------  Cardiac Enzymes No results for input(s): TROPONINI in the last 168 hours. ------------------------------------------------------------------------------------------------------------------  RADIOLOGY:  No results found.  EKG:    IMPRESSION AND PLAN:    Carrie Burns  is a 68 y.o. female with a known history of diabetes on insulin, hypertension is admitted from podiatry office with worsening left second toe infection/necrosis patient currently is on oral antibiotic with Bactrim which she is taking for last seven days.  1. left second toe necrosis/chronic infection -podiatry plans for amputation -NPO after midnight -vascular consultation placed with Dr. Delana Meyer -continue oral Bactrim -check CBC, met B, PT/INR  2. type II diabetes -continue home insulin regimen and sliding scale -patient takes oral glyburide  3. Hypertension continue  amlodipine, atenolol, losartan/hydrochlorothiazide 4. Chronic anemia continue iron supplements  All the records are reviewed and case discussed with ED provider. Management plans discussed with the patient, family and they are in agreement.  CODE STATUS: full  TOTAL TIME TAKING CARE OF THIS PATIENT: *50* minutes.    Fritzi Mandes M.D on 09/23/2018 at 1:32 PM  Between 7am to 6pm - Pager - [phone]  After 6pm go to [email or web address] - password [email or web address] [facility]  [facility]  Office  [phone]  CC: Primary care physician; Cristy Folks, PA-C

## 2018-09-23 NOTE — Progress Notes (Signed)
[facility]  Daily Progress Note   Asked to consult by: Dr. Ether Griffins  Patient with possible atherosclerotic disease with ulcer formation to the left foot (second toe).  Recommend: Moving forward with toe amputation / debridement if bleeding during procedure is noted to be poor will plan for left lower extremity angiogram on Monday. If bleeding is brisk will follow up as an outpatient.   Full consult to follow.   Cleda Daub PA-C 09/23/2018 5:42 PM

## 2018-09-24 ENCOUNTER — Inpatient Hospital Stay: Payer: Medicare Other | Admitting: Anesthesiology

## 2018-09-24 ENCOUNTER — Encounter
Admission: AD | Disposition: A | Discharge status: Home / Self Care | Payer: Self-pay | Source: Ambulatory Visit | Attending: Internal Medicine

## 2018-09-24 ENCOUNTER — Encounter: Payer: Self-pay | Admitting: *Deleted

## 2018-09-24 DIAGNOSIS — Z22322 Carrier or suspected carrier of Methicillin resistant Staphylococcus aureus: Secondary | ICD-10-CM

## 2018-09-24 HISTORY — PX: AMPUTATION TOE: SHX6595

## 2018-09-24 HISTORY — DX: Carrier or suspected carrier of methicillin resistant Staphylococcus aureus: Z22.322

## 2018-09-24 LAB — GLUCOSE, CAPILLARY
GLUCOSE-CAPILLARY: 233 mg/dL — AB (ref 70–99)
Glucose-Capillary: 265 mg/dL — ABNORMAL HIGH (ref 70–99)
Glucose-Capillary: 248 mg/dL — ABNORMAL HIGH (ref 70–99)
Glucose-Capillary: 251 mg/dL — ABNORMAL HIGH (ref 70–99)
Glucose-Capillary: 222 mg/dL — ABNORMAL HIGH (ref 70–99)

## 2018-09-24 LAB — HIV ANTIBODY (ROUTINE TESTING W REFLEX): HIV Screen 4th Generation wRfx: NONREACTIVE

## 2018-09-24 SURGERY — AMPUTATION, TOE
Anesthesia: General | Site: Foot | Laterality: Left

## 2018-09-24 MED ORDER — PROPOFOL 10 MG/ML IV BOLUS
INTRAVENOUS | Status: AC
  Filled 2018-09-24: qty 20

## 2018-09-24 MED ORDER — PHENYLEPHRINE HCL 10 MG/ML IJ SOLN
INTRAMUSCULAR | Status: DC | PRN
  Administered 2018-09-24: 50 ug via INTRAVENOUS

## 2018-09-24 MED ORDER — LIDOCAINE HCL (PF) 2 % IJ SOLN
INTRAMUSCULAR | Status: AC
  Filled 2018-09-24: qty 10

## 2018-09-24 MED ORDER — PROPOFOL 10 MG/ML IV BOLUS
INTRAVENOUS | Status: DC | PRN
  Administered 2018-09-24: 60 mg via INTRAVENOUS

## 2018-09-24 MED ORDER — LIDOCAINE HCL (PF) 1 % IJ SOLN
INTRAMUSCULAR | Status: DC | PRN
  Administered 2018-09-24: 5 mL

## 2018-09-24 MED ORDER — LIDOCAINE HCL (PF) 1 % IJ SOLN
INTRAMUSCULAR | Status: AC
  Filled 2018-09-24: qty 30

## 2018-09-24 MED ORDER — PROPOFOL 500 MG/50ML IV EMUL
INTRAVENOUS | Status: DC | PRN
  Administered 2018-09-24: 120 ug/kg/min via INTRAVENOUS

## 2018-09-24 MED ORDER — BUPIVACAINE HCL (PF) 0.5 % IJ SOLN
INTRAMUSCULAR | Status: AC
  Filled 2018-09-24: qty 30

## 2018-09-24 MED ORDER — BUPIVACAINE HCL 0.5 % IJ SOLN
INTRAMUSCULAR | Status: DC | PRN
  Administered 2018-09-24: 5 mL

## 2018-09-24 MED ORDER — FENTANYL CITRATE (PF) 100 MCG/2ML IJ SOLN
INTRAMUSCULAR | Status: AC
  Filled 2018-09-24: qty 2

## 2018-09-24 SURGICAL SUPPLY — 42 items
BANDAGE ELASTIC 4 LF NS (GAUZE/BANDAGES/DRESSINGS) ×2 IMPLANT
BLADE OSC/SAGITTAL MD 5.5X18 (BLADE) ×2 IMPLANT
BLADE SURG MINI STRL (BLADE) ×2 IMPLANT
BNDG CONFORM 3 STRL LF (GAUZE/BANDAGES/DRESSINGS) ×2 IMPLANT
BNDG ESMARK 4X12 TAN STRL LF (GAUZE/BANDAGES/DRESSINGS) IMPLANT
BNDG GAUZE 4.5X4.1 6PLY STRL (MISCELLANEOUS) ×2 IMPLANT
CANISTER SUCT 1200ML W/VALVE (MISCELLANEOUS) ×2 IMPLANT
COVER WAND RF STERILE (DRAPES) ×2 IMPLANT
DRAPE FLUOR MINI C-ARM 54X84 (DRAPES) IMPLANT
DRAPE XRAY CASSETTE 23X24 (DRAPES) IMPLANT
DURAPREP 26ML APPLICATOR (WOUND CARE) ×2 IMPLANT
ELECT REM PT RETURN 9FT ADLT (ELECTROSURGICAL) ×2
ELECTRODE REM PT RTRN 9FT ADLT (ELECTROSURGICAL) ×1 IMPLANT
GAUZE PACKING IODOFORM 1/2 (PACKING) IMPLANT
GAUZE PETRO XEROFOAM 1X8 (MISCELLANEOUS) ×2 IMPLANT
GAUZE SPONGE 4X4 12PLY STRL (GAUZE/BANDAGES/DRESSINGS) ×2 IMPLANT
GAUZE STRETCH 2X75IN STRL (MISCELLANEOUS) ×2 IMPLANT
GLOVE BIO SURGEON STRL SZ7.5 (GLOVE) ×2 IMPLANT
GLOVE INDICATOR 8.0 STRL GRN (GLOVE) ×2 IMPLANT
GOWN STRL REUS W/ TWL LRG LVL3 (GOWN DISPOSABLE) ×2 IMPLANT
GOWN STRL REUS W/TWL LRG LVL3 (GOWN DISPOSABLE) ×2
KIT TURNOVER KIT A (KITS) ×2 IMPLANT
LABEL OR SOLS (LABEL) ×2 IMPLANT
NEEDLE FILTER BLUNT 18X 1/2SAF (NEEDLE) ×1
NEEDLE FILTER BLUNT 18X1 1/2 (NEEDLE) ×1 IMPLANT
NEEDLE HYPO 25X1 1.5 SAFETY (NEEDLE) ×2 IMPLANT
NS IRRIG 500ML POUR BTL (IV SOLUTION) ×2 IMPLANT
PACK EXTREMITY ARMC (MISCELLANEOUS) ×2 IMPLANT
PAD ABD DERMACEA PRESS 5X9 (GAUZE/BANDAGES/DRESSINGS) ×4 IMPLANT
PULSAVAC PLUS IRRIG FAN TIP (DISPOSABLE) ×2
SHIELD FULL FACE ANTIFOG 7M (MISCELLANEOUS) IMPLANT
SOL .9 NS 3000ML IRR  AL (IV SOLUTION)
SOL .9 NS 3000ML IRR UROMATIC (IV SOLUTION) IMPLANT
STOCKINETTE M/LG 89821 (MISCELLANEOUS) ×2 IMPLANT
STRAP SAFETY 5IN WIDE (MISCELLANEOUS) IMPLANT
SUT ETHILON 3-0 FS-10 30 BLK (SUTURE) ×2
SUT ETHILON 5-0 FS-2 18 BLK (SUTURE) ×2 IMPLANT
SUT VIC AB 4-0 FS2 27 (SUTURE) IMPLANT
SUTURE EHLN 3-0 FS-10 30 BLK (SUTURE) ×1 IMPLANT
SWAB CULTURE AMIES ANAERIB BLU (MISCELLANEOUS) ×2 IMPLANT
SYR 10ML LL (SYRINGE) ×6 IMPLANT
TIP FAN IRRIG PULSAVAC PLUS (DISPOSABLE) ×1 IMPLANT

## 2018-09-24 NOTE — Progress Notes (Signed)
[facility] - [place] [facility]   PATIENT NAME: Carrie Burns    MR#:  [id number]  DATE OF BIRTH:  1951/01/02  SUBJECTIVE:  patient denies any complaints. Awaiting her planned second toe amputation today.  REVIEW OF SYSTEMS:   Review of Systems  Constitutional: Negative for chills, fever and weight loss.  HENT: Negative for ear discharge, ear pain and nosebleeds.   Eyes: Negative for blurred vision, pain and discharge.  Respiratory: Negative for sputum production, shortness of breath, wheezing and stridor.   Cardiovascular: Negative for chest pain, palpitations, orthopnea and PND.  Gastrointestinal: Negative for abdominal pain, diarrhea, nausea and vomiting.  Genitourinary: Negative for frequency and urgency.  Musculoskeletal: Negative for back pain and joint pain.  Neurological: Negative for sensory change, speech change, focal weakness and weakness.  Psychiatric/Behavioral: Negative for depression and hallucinations. The patient is not nervous/anxious.    Tolerating Diet:npo Tolerating PT: pending  DRUG ALLERGIES:  No Known Allergies  VITALS:  Blood pressure 110/66, pulse 62, temperature (!) 97.1 F (36.2 C), temperature source Tympanic, resp. rate 20, SpO2 99 %.  PHYSICAL EXAMINATION:   Physical Exam  GENERAL:  67 y.o.-year-old patient lying in the bed with no acute distress.  EYES: Pupils equal, round, reactive to light and accommodation. No scleral icterus. Extraocular muscles intact.  HEENT: Head atraumatic, normocephalic. Oropharynx and nasopharynx clear.  NECK:  Supple, no jugular venous distention. No thyroid enlargement, no tenderness.  LUNGS: Normal breath sounds bilaterally, no wheezing, rales, rhonchi. No use of accessory muscles of respiration.  CARDIOVASCULAR: S1, S2 normal. No murmurs, rubs, or gallops.  ABDOMEN: Soft, nontender, nondistended. Bowel sounds present. No organomegaly or mass.  EXTREMITIES: No cyanosis, clubbing or  edema b/l.   Dressing left foot NEUROLOGIC: Cranial nerves II through XII are intact. No focal Motor or sensory deficits b/l.   PSYCHIATRIC:  patient is alert and oriented x 3.  SKIN: No obvious rash, lesion, or ulcer.   LABORATORY PANEL:  CBC Recent Labs  Lab 09/23/18 1333  WBC 4.4  HGB 12.7  HCT 40.3  PLT 319    Chemistries  Recent Labs  Lab 09/23/18 1333  NA 137  K 4.1  CL 103  CO2 28  GLUCOSE 114*  BUN 15  CREATININE 0.93  CALCIUM 9.3   Cardiac Enzymes No results for input(s): TROPONINI in the last 168 hours. RADIOLOGY:  No results found. ASSESSMENT AND PLAN:   Carrie Burns  is a 67 y.o. female with a known history of diabetes on insulin, hypertension is admitted from podiatry office with worsening left second toe infection/necrosis patient currently is on oral antibiotic with Bactrim which she is taking for last seven days.  1. Left second toe necrosis/chronic infection -podiatry plans for amputation today -NPO after midnight -vascular consultation placed with Dr. Era Skeen for LE angiogram on monday depending on how her surgery goes today -continue oral Bactrim -labs look ok  2. type II diabetes -continue home insulin regimen and sliding scale -patient takes oral glyburide  3. Hypertension continue amlodipine, atenolol, losartan/hydrochlorothiazide  4. Chronic anemia continue iron supplements  PT to see after surgery  Case discussed with Care Management/Social Worker. Management plans discussed with the patient, family and they are in agreement.  CODE STATUS: full  DVT Prophylaxis: lovenox  TOTAL TIME TAKING CARE OF THIS PATIENT: *30* minutes.  >50% time spent on counselling and coordination of care  POSSIBLE D/C IN *1-2 DAYS, DEPENDING ON CLINICAL CONDITION.  Note: This dictation was  prepared with Dragon dictation along with smaller phrase technology. Any transcriptional errors that result from this process are  unintentional.  Enedina Finner M.D on 09/24/2018 at 10:30 AM  Between 7am to 6pm - Pager - [phone]  After 6pm go to [email or web address] - Social research officer, government  [facility]  Office  [phone]  CC: Primary care physician; Carrie Beath, PA-CPatient ID: Carrie Burns, female   DOB: Oct 26, 1951, 67 y.o.   MRN: [id number]

## 2018-09-24 NOTE — Anesthesia Procedure Notes (Signed)
Procedure Name: MAC Performed by: Cyndel Griffey, CRNA Pre-anesthesia Checklist: Patient identified, Emergency Drugs available, Suction available, Patient being monitored and Timeout performed Patient Re-evaluated:Patient Re-evaluated prior to induction Oxygen Delivery Method: Nasal cannula       

## 2018-09-24 NOTE — Progress Notes (Signed)
[facility] Daily Progress Note   Patient underwent amputation of second left toe today by podiatry. Patient was noted to have excellent bleeding and perfusion. At this time, there is no plan to move forward with any type of endovascular intervention.   Vascular surgery will sign off at this time.   Discussed with Dr. Wallis Mart Alene Bergerson PA-C 09/24/2018 12:32 PM

## 2018-09-24 NOTE — Anesthesia Post-op Follow-up Note (Signed)
Anesthesia QCDR form completed.        

## 2018-09-24 NOTE — Anesthesia Postprocedure Evaluation (Signed)
Anesthesia Post Note  Patient: Carrie Burns  Procedure(s) Performed: AMPUTATION SECOND TOE (Left Foot)  Patient location during evaluation: PACU Anesthesia Type: General Level of consciousness: awake and alert and oriented Pain management: pain level controlled Vital Signs Assessment: post-procedure vital signs reviewed and stable Respiratory status: spontaneous breathing, nonlabored ventilation and respiratory function stable Cardiovascular status: blood pressure returned to baseline and stable Postop Assessment: no signs of nausea or vomiting Anesthetic complications: no     Last Vitals:  Vitals:   09/24/18 1120 09/24/18 1245  BP: 121/74 116/61  Pulse: 61 63  Resp: 18 18  Temp: 36.7 C 36.6 C  SpO2: 99% 100%    Last Pain:  Vitals:   09/24/18 1245  TempSrc: Oral  PainSc:                  [name]

## 2018-09-24 NOTE — Transfer of Care (Signed)
Immediate Anesthesia Transfer of Care Note  Patient: Carrie Burns  Procedure(s) Performed: AMPUTATION SECOND TOE (Left Foot)  Patient Location: PACU  Anesthesia Type:MAC  Level of Consciousness: awake and responds to stimulation  Airway & Oxygen Therapy: Patient Spontanous Breathing and Patient connected to nasal cannula oxygen  Post-op Assessment: Report given to RN and Post -op Vital signs reviewed and stable  Post vital signs: Reviewed and stable  Last Vitals:  Vitals Value Taken Time  BP 96/63 09/24/2018 10:33 AM  Temp    Pulse 64 09/24/2018 10:33 AM  Resp 14 09/24/2018 10:33 AM  SpO2 96 % 09/24/2018 10:33 AM    Last Pain:  Vitals:   09/24/18 0925  TempSrc: Tympanic  PainSc:          Complications: No apparent anesthesia complications

## 2018-09-24 NOTE — Progress Notes (Signed)
Patient underwent amputation of left second toe today.  Patient tolerated the procedure and anesthesia quite well.  Excellent bleeding and perfusion of the skin flaps were noted.  No deep infection was noted.  I suspect she will be able to be discharged home postoperative day #1.  At this point excellent bleeding was noted and no further vascular work-up is warranted at this time.  We can always consult vascular in the outpatient setting.  We will follow-up tomorrow morning for dressing change with anticipation of discharge tomorrow.

## 2018-09-24 NOTE — Op Note (Signed)
Operative note   Surgeon:Emaad Nanna Armed forces logistics/support/administrative officer: None    Preop diagnosis: Necrotic left second toe    Postop diagnosis: Same    Procedure: Annotation left second toe MTPJ    EBL: Minimal    Anesthesia:local and IV sedation.  Local consisted of a one-to-one mixture of 1% lidocaine plain and 0.5% bupivacaine plain    Hemostasis: None    Specimen: Necrotic left second toe and deep wound culture    Complications: None    Operative indications:Carrie Burns is an 67 y.o. that presents today for surgical intervention.  The risks/benefits/alternatives/complications have been discussed and consent has been given.    Procedure:  Patient was brought into the OR and placed on the operating table in thesupine position. After anesthesia was obtained theleft lower extremity was prepped and draped in usual sterile fashion.  Attention was directed to the left second toe where distal to the MTPJ necrotic tissue was noted with exposure of the PIPJ.  Full-thickness incision was made from dorsal to plantar creating to medial and laterally based flaps.  The toe was disarticulated at the MTPJ and sent for pathological examination.  A deep wound culture was performed at this time.  The wound was flushed with 500 mL's of sterile saline.  Closure was performed after cauterization of bleeders.  Closure performed with a 5-0 nylon.  A bulky sterile dressing was applied.    Patient tolerated the procedure and anesthesia well.  Was transported from the OR to the PACU with all vital signs stable and vascular status intact. To be discharged per routine protocol.  Will follow up in approximately 1 week in the outpatient clinic.

## 2018-09-25 DIAGNOSIS — E10621 Type 1 diabetes mellitus with foot ulcer: Secondary | ICD-10-CM | POA: Diagnosis not present

## 2018-09-25 DIAGNOSIS — L97509 Non-pressure chronic ulcer of other part of unspecified foot with unspecified severity: Secondary | ICD-10-CM

## 2018-09-25 LAB — GLUCOSE, CAPILLARY
GLUCOSE-CAPILLARY: 206 mg/dL — AB (ref 70–99)
Glucose-Capillary: 172 mg/dL — ABNORMAL HIGH (ref 70–99)
Glucose-Capillary: 183 mg/dL — ABNORMAL HIGH (ref 70–99)

## 2018-09-25 MED ORDER — SULFAMETHOXAZOLE-TRIMETHOPRIM 800-160 MG PO TABS: 1.0000 | ORAL_TABLET | Freq: Two times a day (BID) | ORAL | 0 refills | Status: AC

## 2018-09-25 NOTE — Progress Notes (Signed)
Daily Progress Note   Subjective  - 1 Day Post-Op  F/u left 2nd toe amputation  Objective Vitals:   09/24/18 1953 09/24/18 2357 09/25/18 0427 09/25/18 0821  BP: 115/63 110/62 108/69 108/72  Pulse: 75 72 67 67  Resp: 18 19 18 18   Temp: 98.2 F (36.8 C) 98.7 F (37.1 C) 98.3 F (36.8 C) 98.2 F (36.8 C)  TempSrc: Oral Oral Oral Oral  SpO2: 97% 98% 96% 100%    Physical Exam: Skin flaps well perfused.  No s/s infection.  Laboratory CBC    Component Value Date/Time   WBC 4.4 09/23/2018 1333   HGB 12.7 09/23/2018 1333   HCT 40.3 09/23/2018 1333   PLT 319 09/23/2018 1333    BMET    Component Value Date/Time   NA 137 09/23/2018 1333   K 4.1 09/23/2018 1333   CL 103 09/23/2018 1333   CO2 28 09/23/2018 1333   GLUCOSE 114 (H) 09/23/2018 1333   BUN 15 09/23/2018 1333   CREATININE 0.93 09/23/2018 1333   CALCIUM 9.3 09/23/2018 1333   GFRNONAA >60 09/23/2018 1333   GFRAA >60 09/23/2018 1333    Assessment/Planning: Necrotic left 2nd toe s/p amputation.   OK from podiatry standpoint to d/c  Pt has po antibiotics at home and will continue to take.  WBAT in boot.  Should minimize weight bearing until wound has healed  F/u with me this week.  Pt to call for appt.  [name] A  09/25/2018, 9:31 AM

## 2018-09-25 NOTE — Discharge Instructions (Signed)
1. Weight bearing  Of left leg as tolerated, wear the boot 2. Podiatry f/u in 3-4 days

## 2018-09-25 NOTE — Discharge Summary (Signed)
[employer] [facility] [place] at [facility]   PATIENT NAME: Carrie Burns    MR#:  [id number]  DATE OF BIRTH:  [birth year]  DATE OF ADMISSION:  09/23/2018   ADMITTING PHYSICIAN: Linus Galas, DPM  DATE OF DISCHARGE: 09/25/2018  2:43 PM  PRIMARY CARE PHYSICIAN: Rosemarie Beath, PA-C   ADMISSION DIAGNOSIS:   ulcer lt foot  DISCHARGE DIAGNOSIS:   Active Problems:   Type 1 diabetes mellitus with foot ulcer (HCC)   SECONDARY DIAGNOSIS:   Past Medical History:  Diagnosis Date  . Diabetes mellitus without complication [facility])     HOSPITAL COURSE:   67 year old female with past medical history significant for insulin-dependent diabetes mellitus who had a left foot first toe fracture that was buddy taped with her second toe comes with second toe necrosis.  1.  Left foot second toe ulceration-full-thickness ulceration with exposure of joint seen.  She is admitted for necrosis of the second toe with history of diabetes. -Appreciate podiatry consult and vascular consult.  Patient did have good blood flow and brisk bleeding during surgery.  No angiogram or vascular procedures recommended during this admission. -Patient underwent amputation of the left second toe.  No infection seen. -Continue Bactrim at discharge.  Dressing is present and will be changed by podiatry in the office next week. -Weightbearing as tolerated in boot.  Pain is well controlled and patient is being discharged today  2.  Hypertension-on Norvasc, losartan, hydrochlorothiazide and atenolol  3.  Diabetes mellitus-on Lantus, glipizide and exenatide  4.  GERD-on PPI  Patient is ambulating well and will be discharged today   DISCHARGE CONDITIONS:   Guarded  CONSULTS OBTAINED:   Podiatry consult by Dr. [name]  DRUG ALLERGIES:   No Known Allergies DISCHARGE MEDICATIONS:   Allergies as of 09/25/2018   No Known Allergies     Medication List    STOP taking these  medications   hydrochlorothiazide 50 MG tablet Commonly known as:  HYDRODIURIL   metoprolol succinate 100 MG 24 hr tablet Commonly known as:  TOPROL-XL   sertraline 25 MG tablet Commonly known as:  ZOLOFT     TAKE these medications   amLODipine 10 MG tablet Commonly known as:  NORVASC Take 10 mg by mouth daily.   aspirin EC 81 MG tablet Take 81 mg by mouth daily.   atenolol 25 MG tablet Commonly known as:  TENORMIN Take 25 mg by mouth daily.   BYDUREON BCISE 2 MG/0.85ML Auij Generic drug:  Exenatide ER Inject 2 mg into the skin once a week.   diclofenac sodium 1 % Gel Commonly known as:  VOLTAREN Apply 4 g topically 4 (four) times daily as needed (pain).   docusate sodium 100 MG capsule Commonly known as:  COLACE Take 100 mg by mouth 2 (two) times daily.   ferrous sulfate 325 (65 FE) MG tablet Take 325 mg by mouth daily.   glipiZIDE 5 MG tablet Commonly known as:  GLUCOTROL Take 5 mg by mouth 2 (two) times daily before a meal.   ibuprofen 200 MG tablet Commonly known as:  ADVIL,MOTRIN Take 200 mg by mouth every 6 (six) hours as needed.   insulin glargine 100 UNIT/ML injection Commonly known as:  LANTUS Inject 55 Units into the skin 2 (two) times daily.   losartan-hydrochlorothiazide 100-25 MG tablet Commonly known as:  HYZAAR Take 1 tablet by mouth daily.   pantoprazole 40 MG tablet Commonly known as:  PROTONIX Take 40 mg by  mouth daily.   sulfamethoxazole-trimethoprim 800-160 MG tablet Commonly known as:  BACTRIM DS,SEPTRA DS Take 1 tablet by mouth every 12 (twelve) hours for 7 days.   vitamin C 500 MG tablet Commonly known as:  ASCORBIC ACID Take 500 mg by mouth daily.        DISCHARGE INSTRUCTIONS:   1. PCP f/u in 1-2 weeks 2. Podiatry f/u in 1 week  DIET:   Cardiac diet  ACTIVITY:   Activity as tolerated  OXYGEN:   Home Oxygen: No.  Oxygen Delivery: room air  DISCHARGE LOCATION:   home   If you experience worsening of  your admission symptoms, develop shortness of breath, life threatening emergency, suicidal or homicidal thoughts you must seek medical attention immediately by calling 911 or calling your MD immediately  if symptoms less severe.  You Must read complete instructions/literature along with all the possible adverse reactions/side effects for all the Medicines you take and that have been prescribed to you. Take any new Medicines after you have completely understood and accpet all the possible adverse reactions/side effects.   Please note  You were cared for by a hospitalist during your hospital stay. If you have any questions about your discharge medications or the care you received while you were in the hospital after you are discharged, you can call the unit and asked to speak with the hospitalist on call if the hospitalist that took care of you is not available. Once you are discharged, your primary care physician will handle any further medical issues. Please note that NO REFILLS for any discharge medications will be authorized once you are discharged, as it is imperative that you return to your primary care physician (or establish a relationship with a primary care physician if you do not have one) for your aftercare needs so that they can reassess your need for medications and monitor your lab values.    On the day of Discharge:  VITAL SIGNS:   Blood pressure 108/72, pulse 67, temperature 98.2 F (36.8 C), temperature source Oral, resp. rate 18, SpO2 100 %.  PHYSICAL EXAMINATION:    GENERAL:  67 y.o.-year-old patient lying in the bed with no acute distress.  EYES: Pupils equal, round, reactive to light and accommodation. No scleral icterus. Extraocular muscles intact.  HEENT: Head atraumatic, normocephalic. Oropharynx and nasopharynx clear.  NECK:  Supple, no jugular venous distention. No thyroid enlargement, no tenderness.  LUNGS: Normal breath sounds bilaterally, no wheezing, rales,rhonchi  or crepitation. No use of accessory muscles of respiration.  CARDIOVASCULAR: S1, S2 normal. No murmurs, rubs, or gallops.  ABDOMEN: Soft, non-tender, non-distended. Bowel sounds present. No organomegaly or mass.  EXTREMITIES: No pedal edema, cyanosis, or clubbing. Left foot dressing in place. S/p 2nd toe amputation NEUROLOGIC: Cranial nerves II through XII are intact. Muscle strength 5/5 in all extremities. Sensation intact. Gait not checked.  PSYCHIATRIC: The patient is alert and oriented x 3.  SKIN: No obvious rash, lesion, or ulcer.   DATA REVIEW:   CBC Recent Labs  Lab 09/23/18 1333  WBC 4.4  HGB 12.7  HCT 40.3  PLT 319    Chemistries  Recent Labs  Lab 09/23/18 1333  NA 137  K 4.1  CL 103  CO2 28  GLUCOSE 114*  BUN 15  CREATININE 0.93  CALCIUM 9.3     Microbiology Results  Results for orders placed or performed during the hospital encounter of 09/23/18  Surgical PCR screen     Status: None  Collection Time: 09/23/18  6:32 PM  Result Value Ref Range Status   MRSA, PCR NEGATIVE NEGATIVE Final   Staphylococcus aureus NEGATIVE NEGATIVE Final    Comment: (NOTE) The Xpert SA Assay (FDA approved for NASAL specimens in patients 71 years of age and older), is one component of a comprehensive surveillance program. It is not intended to diagnose infection nor to guide or monitor treatment. Performed at [facility], [address] [place], [place] [address]   Aerobic/Anaerobic Culture (surgical/deep wound)     Status: None (Preliminary result)   Collection Time: 09/24/18 10:11 AM  Result Value Ref Range Status   Specimen Description   Final    TOE Performed at [facility], [address], [place], [place] [address]    Special Requests   Final    LEFT,ABCESS Performed at [facility], [address] [place], [place] [address]    Gram Stain NO WBC SEEN NO ORGANISMS SEEN   Final   Culture   Final    NO GROWTH < 24  HOURS Performed at [facility], [address] [place], [place] [address]    Report Status PENDING  Incomplete    RADIOLOGY:  No results found.   Management plans discussed with the patient, family and they are in agreement.  CODE STATUS:     Code Status Orders  (From admission, onward)         Start     Ordered   09/23/18 1248  Full code  Continuous     09/23/18 1247        Code Status History    This patient has a current code status but no historical code status.      TOTAL TIME TAKING CARE OF THIS PATIENT: 38 minutes.    Enid Baas M.D on 09/25/2018 at 2:47 PM  Between 7am to 6pm - Pager - [phone]  After 6pm go to [email or web address] - password [email or web address] [facility]  [employer] [place] [facility]  Office  [phone]  CC: Primary care physician; Rosemarie Beath, PA-C   Note: This dictation was prepared with Dragon dictation along with smaller phrase technology. Any transcriptional errors that result from this process are unintentional.

## 2018-09-28 LAB — SURGICAL PATHOLOGY

## 2018-09-30 LAB — AEROBIC/ANAEROBIC CULTURE W GRAM STAIN (SURGICAL/DEEP WOUND): Gram Stain: NONE SEEN

## 2018-10-01 ENCOUNTER — Telehealth: Payer: Self-pay | Admitting: Licensed Clinical Social Worker

## 2018-10-01 NOTE — Telephone Encounter (Signed)
EMMI flagged patient for answering yes to loss of interest in things. Clinical Child psychotherapist (CSW) contacted patient via telephone. Per patient she is doing good and her foot is healing well. Per patient she is going to Dr. Irene Limbo office Monday to get her staples removed. Patient reported that she does not have any symptoms of depression or anxiety. Patient scored 0 on PHQ-9. Patient reported no needs or concerns.   [employer], LCSW [phone]

## 2020-01-03 ENCOUNTER — Other Ambulatory Visit: Payer: Self-pay | Admitting: Family Medicine

## 2020-01-03 DIAGNOSIS — Z1231 Encounter for screening mammogram for malignant neoplasm of breast: Secondary | ICD-10-CM

## 2020-03-30 ENCOUNTER — Ambulatory Visit: Payer: Self-pay | Attending: Internal Medicine

## 2020-03-30 DIAGNOSIS — Z23 Encounter for immunization: Secondary | ICD-10-CM

## 2020-03-30 NOTE — Progress Notes (Signed)
   Covid-19 Vaccination Clinic  Name:  Lusero Nordlund    MRN: [id number] DOB: 08/12/1954  03/30/2020  Ms. Gravatt was observed post Covid-19 immunization for 15 minutes without incident. She was provided with Vaccine Information Sheet and instruction to access the V-Safe system.   Ms. Luepke was instructed to call 911 with any severe reactions post vaccine: Marland Kitchen Difficulty breathing  . Swelling of face and throat  . A fast heartbeat  . A bad rash all over body  . Dizziness and weakness   Immunizations Administered    Name Date Dose VIS Date Route   Pfizer COVID-19 Vaccine 03/30/2020 11:33 AM 0.3 mL 02/01/2019 Intramuscular   Manufacturer: [employer], [employer]   Lot: [id number]   NDC: [address]-0

## 2020-04-24 ENCOUNTER — Ambulatory Visit: Payer: Medicare Other | Attending: Internal Medicine

## 2020-04-24 DIAGNOSIS — Z23 Encounter for immunization: Secondary | ICD-10-CM

## 2020-04-24 NOTE — Progress Notes (Signed)
   Covid-19 Vaccination Clinic  Name:  STINA GANE    MRN: [id number] DOB: Sep 01, 1951  04/24/2020  Ms. Germer was observed post Covid-19 immunization for 15 minutes without incident. She was provided with Vaccine Information Sheet and instruction to access the V-Safe system.   Ms. Mirkin was instructed to call 911 with any severe reactions post vaccine: Marland Kitchen Difficulty breathing  . Swelling of face and throat  . A fast heartbeat  . A bad rash all over body  . Dizziness and weakness   Immunizations Administered    Name Date Dose VIS Date Route   Pfizer COVID-19 Vaccine 04/24/2020 11:11 AM 0.3 mL 02/01/2019 Intramuscular   Manufacturer: [employer], [employer]   Lot: [id number]   NDC: [address]-9

## 2020-07-20 ENCOUNTER — Other Ambulatory Visit: Payer: Self-pay | Admitting: Family Medicine

## 2020-07-20 DIAGNOSIS — M7989 Other specified soft tissue disorders: Secondary | ICD-10-CM

## 2020-07-20 DIAGNOSIS — E1159 Type 2 diabetes mellitus with other circulatory complications: Secondary | ICD-10-CM

## 2020-09-20 ENCOUNTER — Other Ambulatory Visit: Payer: Self-pay | Admitting: Family Medicine

## 2020-09-20 DIAGNOSIS — Z8701 Personal history of pneumonia (recurrent): Secondary | ICD-10-CM

## 2021-06-26 ENCOUNTER — Other Ambulatory Visit (INDEPENDENT_AMBULATORY_CARE_PROVIDER_SITE_OTHER): Payer: Self-pay | Admitting: Podiatry

## 2021-06-26 ENCOUNTER — Other Ambulatory Visit: Payer: Self-pay | Admitting: Podiatry

## 2021-06-26 DIAGNOSIS — E1142 Type 2 diabetes mellitus with diabetic polyneuropathy: Secondary | ICD-10-CM

## 2021-06-26 DIAGNOSIS — M79671 Pain in right foot: Secondary | ICD-10-CM

## 2021-06-27 ENCOUNTER — Ambulatory Visit (INDEPENDENT_AMBULATORY_CARE_PROVIDER_SITE_OTHER): Payer: Medicare Other

## 2021-06-27 ENCOUNTER — Other Ambulatory Visit: Payer: Self-pay

## 2021-06-27 DIAGNOSIS — M79671 Pain in right foot: Secondary | ICD-10-CM

## 2021-06-27 DIAGNOSIS — E1142 Type 2 diabetes mellitus with diabetic polyneuropathy: Secondary | ICD-10-CM | POA: Diagnosis not present

## 2021-07-02 ENCOUNTER — Other Ambulatory Visit: Payer: Self-pay

## 2021-07-02 ENCOUNTER — Encounter
Admission: RE | Admit: 2021-07-02 | Discharge: 2021-07-02 | Disposition: A | Discharge status: Home / Self Care | Payer: Medicare Other | Source: Ambulatory Visit | Attending: Podiatry | Admitting: Podiatry

## 2021-07-02 ENCOUNTER — Encounter: Payer: Self-pay | Admitting: Urgent Care

## 2021-07-02 DIAGNOSIS — Z01818 Encounter for other preprocedural examination: Secondary | ICD-10-CM | POA: Insufficient documentation

## 2021-07-02 DIAGNOSIS — E118 Type 2 diabetes mellitus with unspecified complications: Secondary | ICD-10-CM | POA: Insufficient documentation

## 2021-07-02 DIAGNOSIS — I10 Essential (primary) hypertension: Secondary | ICD-10-CM | POA: Diagnosis not present

## 2021-07-02 HISTORY — DX: Vitamin D deficiency, unspecified: E55.9

## 2021-07-02 HISTORY — DX: Essential (primary) hypertension: I10

## 2021-07-02 HISTORY — DX: Gastro-esophageal reflux disease without esophagitis: K21.9

## 2021-07-02 HISTORY — DX: Diverticulosis of intestine, part unspecified, without perforation or abscess without bleeding: K57.90

## 2021-07-02 HISTORY — DX: Bronchitis, not specified as acute or chronic: J40

## 2021-07-02 HISTORY — DX: Gout, unspecified: M10.9

## 2021-07-02 HISTORY — DX: Pure hypercholesterolemia, unspecified: E78.00

## 2021-07-02 HISTORY — DX: Migraine, unspecified, not intractable, without status migrainosus: G43.909

## 2021-07-02 LAB — CBC
HCT: 35.7 % — ABNORMAL LOW (ref 36.0–46.0)
Hemoglobin: 11.8 g/dL — ABNORMAL LOW (ref 12.0–15.0)
MCH: 28.2 pg (ref 26.0–34.0)
MCHC: 33.1 g/dL (ref 30.0–36.0)
MCV: 85.2 fL (ref 80.0–100.0)
Platelets: 190 10*3/uL (ref 150–400)
RBC: 4.19 MIL/uL (ref 3.87–5.11)
RDW: 13.6 % (ref 11.5–15.5)
WBC: 3 10*3/uL — ABNORMAL LOW (ref 4.0–10.5)
nRBC: 0 % (ref 0.0–0.2)

## 2021-07-02 LAB — BASIC METABOLIC PANEL
Anion gap: 10 (ref 5–15)
BUN: 20 mg/dL (ref 8–23)
CO2: 26 mmol/L (ref 22–32)
Calcium: 9.3 mg/dL (ref 8.9–10.3)
Chloride: 104 mmol/L (ref 98–111)
Creatinine, Ser: 0.87 mg/dL (ref 0.44–1.00)
GFR, Estimated: >60 mL/min (ref >60)
Glucose, Bld: 191 mg/dL — ABNORMAL HIGH (ref 70–99)
Potassium: 3.2 mmol/L — ABNORMAL LOW (ref 3.5–5.1)
Sodium: 140 mmol/L (ref 135–145)

## 2021-07-02 NOTE — Patient Instructions (Addendum)
Your procedure is scheduled on: Friday, July 29 Report to the Registration Desk on the 1st floor of the [facility] To find out your arrival time, please call [phone] between 1PM - 3PM on: Thursday, July 28  REMEMBER: Instructions that are not followed completely may result in serious medical risk, up to and including death; or upon the discretion of your surgeon and anesthesiologist your surgery may need to be rescheduled.  Do not eat food after midnight the night before surgery.  No gum chewing, lozengers or hard candies.  You may however, drink water up to 2 hours before you are scheduled to arrive for your surgery. Do not drink anything within 2 hours of your scheduled arrival time.  TAKE THESE MEDICATIONS THE MORNING OF SURGERY  Albuterol inhaler  Use inhalers on the day of surgery and bring to the hospital.  Take 1/2 of usual insulin dose the night before surgery and none on the morning of surgery. ONLY TAKE 24 UNITS OF TRESIBA THE NIGHT BEFORE SURGERY (Thursday NIGHT).  One week prior to surgery: Stop aspirin and Anti-inflammatories (NSAIDS) such as Advil, Aleve, Ibuprofen, Motrin, Naproxen, Naprosyn and Aspirin based products such as Excedrin, Goodys Powder, BC Powder. Stop ANY OVER THE COUNTER supplements until after surgery. (Vitamin D, vitamin C) You may however, continue to take Tylenol if needed for pain up until the day of surgery.  No Alcohol for 24 hours before or after surgery.  On the morning of surgery brush your teeth with toothpaste and water, you may rinse your mouth with mouthwash if you wish. Do not swallow any toothpaste or mouthwash.  Do not wear jewelry, make-up, hairpins, clips or nail polish.  Do not wear lotions, powders, or perfumes.   Do not shave body from the neck down 48 hours prior to surgery just in case you cut yourself which could leave a site for infection.  Also, freshly shaved skin may become irritated if using the CHG  soap.  Contact lenses, hearing aids and dentures may not be worn into surgery.  Do not bring valuables to the hospital. [facility] is not responsible for any missing/lost belongings or valuables.   Use CHG Soap as directed on instruction sheet.  Notify your doctor if there is any change in your medical condition (cold, fever, infection).  Wear comfortable clothing (specific to your surgery type) to the hospital.  After surgery, you can help prevent lung complications by doing breathing exercises.  Take deep breaths and cough every 1-2 hours. Your doctor may order a device called an Incentive Spirometer to help you take deep breaths.  If you are being discharged the day of surgery, you will not be allowed to drive home. You will need a responsible adult (18 years or older) to drive you home and stay with you that night.   If you are taking public transportation, you will need to have a responsible adult (18 years or older) with you. Please confirm with your physician that it is acceptable to use public transportation.   Please call the Pre-admissions Testing Dept. at [phone] if you have any questions about these instructions.  Surgery Visitation Policy:  Patients undergoing a surgery or procedure may have one family member or support person with them as long as that person is not COVID-19 positive or experiencing its symptoms.  That person may remain in the waiting area during the procedure.

## 2021-07-05 ENCOUNTER — Ambulatory Visit
Admission: RE | Admit: 2021-07-05 | Discharge: 2021-07-05 | Disposition: A | Discharge status: Home / Self Care | Payer: Medicare Other | Source: Ambulatory Visit | Attending: Podiatry | Admitting: Podiatry

## 2021-07-05 ENCOUNTER — Ambulatory Visit: Payer: Medicare Other

## 2021-07-05 ENCOUNTER — Encounter
Admission: RE | Disposition: A | Discharge status: Home / Self Care | Payer: Self-pay | Source: Ambulatory Visit | Attending: Podiatry

## 2021-07-05 ENCOUNTER — Encounter: Payer: Self-pay | Admitting: Podiatry

## 2021-07-05 ENCOUNTER — Ambulatory Visit: Payer: Medicare Other | Admitting: Certified Registered Nurse Anesthetist

## 2021-07-05 DIAGNOSIS — Z79899 Other long term (current) drug therapy: Secondary | ICD-10-CM | POA: Diagnosis not present

## 2021-07-05 DIAGNOSIS — Z419 Encounter for procedure for purposes other than remedying health state, unspecified: Secondary | ICD-10-CM

## 2021-07-05 DIAGNOSIS — E1142 Type 2 diabetes mellitus with diabetic polyneuropathy: Secondary | ICD-10-CM | POA: Diagnosis not present

## 2021-07-05 DIAGNOSIS — M67873 Other specified disorders of tendon, right ankle and foot: Secondary | ICD-10-CM | POA: Insufficient documentation

## 2021-07-05 DIAGNOSIS — Z794 Long term (current) use of insulin: Secondary | ICD-10-CM | POA: Diagnosis not present

## 2021-07-05 DIAGNOSIS — M19071 Primary osteoarthritis, right ankle and foot: Secondary | ICD-10-CM | POA: Diagnosis not present

## 2021-07-05 HISTORY — PX: ARTHRODESIS METATARSAL: SHX6565

## 2021-07-05 HISTORY — PX: FOOT ARTHRODESIS: SHX1655

## 2021-07-05 LAB — GLUCOSE, CAPILLARY
Glucose-Capillary: 144 mg/dL — ABNORMAL HIGH (ref 70–99)
Glucose-Capillary: 167 mg/dL — ABNORMAL HIGH (ref 70–99)
Glucose-Capillary: 235 mg/dL — ABNORMAL HIGH (ref 70–99)

## 2021-07-05 SURGERY — FUSION, JOINT, FOOT
Anesthesia: General | Site: Toe | Laterality: Right

## 2021-07-05 MED ORDER — HYDROMORPHONE HCL 1 MG/ML IJ SOLN
INTRAMUSCULAR | Status: AC
  Filled 2021-07-05: qty 1

## 2021-07-05 MED ORDER — 0.9 % SODIUM CHLORIDE (POUR BTL) OPTIME
TOPICAL | Status: DC | PRN
  Administered 2021-07-05: 500 mL
  Administered 2021-07-05: 1000 mL

## 2021-07-05 MED ORDER — HYDROMORPHONE HCL 1 MG/ML IJ SOLN
INTRAMUSCULAR | Status: DC | PRN
  Administered 2021-07-05 (×4): .25 mg via INTRAVENOUS

## 2021-07-05 MED ORDER — ACETAMINOPHEN 10 MG/ML IV SOLN
INTRAVENOUS | Status: DC | PRN
  Administered 2021-07-05: 1000 mg via INTRAVENOUS

## 2021-07-05 MED ORDER — BUPIVACAINE LIPOSOME 1.3 % IJ SUSP
INTRAMUSCULAR | Status: DC | PRN
  Administered 2021-07-05: 10 mL
  Administered 2021-07-05: 20 mL

## 2021-07-05 MED ORDER — ONDANSETRON HCL 4 MG/2ML IJ SOLN: 4.0000 mg | Freq: Once | INTRAMUSCULAR | Status: DC | PRN

## 2021-07-05 MED ORDER — ORAL CARE MOUTH RINSE: 15.0000 mL | Freq: Once | OROMUCOSAL | Status: AC

## 2021-07-05 MED ORDER — OXYCODONE-ACETAMINOPHEN 5-325 MG PO TABS: 1.0000 | ORAL_TABLET | Freq: Four times a day (QID) | ORAL | 0 refills | Status: AC | PRN

## 2021-07-05 MED ORDER — FAMOTIDINE 20 MG PO TABS: 20.0000 mg | ORAL_TABLET | Freq: Once | ORAL | Status: AC

## 2021-07-05 MED ORDER — LIDOCAINE HCL (CARDIAC) PF 100 MG/5ML IV SOSY
PREFILLED_SYRINGE | INTRAVENOUS | Status: DC | PRN
  Administered 2021-07-05: 80 mg via INTRAVENOUS

## 2021-07-05 MED ORDER — SODIUM CHLORIDE 0.9 % IV SOLN
INTRAVENOUS | Status: DC | PRN
  Administered 2021-07-05: 30 ug/min via INTRAVENOUS

## 2021-07-05 MED ORDER — CHLORHEXIDINE GLUCONATE 0.12 % MT SOLN
OROMUCOSAL | Status: AC
  Administered 2021-07-05: 15 mL via OROMUCOSAL
  Filled 2021-07-05: qty 15

## 2021-07-05 MED ORDER — CHLORHEXIDINE GLUCONATE 0.12 % MT SOLN: 15.0000 mL | Freq: Once | OROMUCOSAL | Status: AC

## 2021-07-05 MED ORDER — FAMOTIDINE 20 MG PO TABS
ORAL_TABLET | ORAL | Status: AC
  Administered 2021-07-05: 20 mg via ORAL
  Filled 2021-07-05: qty 1

## 2021-07-05 MED ORDER — CEFAZOLIN SODIUM-DEXTROSE 2-4 GM/100ML-% IV SOLN
INTRAVENOUS | Status: AC
  Filled 2021-07-05: qty 100

## 2021-07-05 MED ORDER — GLYCOPYRROLATE 0.2 MG/ML IJ SOLN
INTRAMUSCULAR | Status: DC | PRN
  Administered 2021-07-05: .2 mg via INTRAVENOUS

## 2021-07-05 MED ORDER — BUPIVACAINE LIPOSOME 1.3 % IJ SUSP
INTRAMUSCULAR | Status: AC
  Filled 2021-07-05: qty 20

## 2021-07-05 MED ORDER — LIDOCAINE HCL (PF) 1 % IJ SOLN
INTRAMUSCULAR | Status: AC
  Filled 2021-07-05: qty 30

## 2021-07-05 MED ORDER — LIDOCAINE-EPINEPHRINE 1 %-1:100000 IJ SOLN
INTRAMUSCULAR | Status: AC
  Filled 2021-07-05: qty 1

## 2021-07-05 MED ORDER — PHENYLEPHRINE HCL (PRESSORS) 10 MG/ML IV SOLN
INTRAVENOUS | Status: AC
  Filled 2021-07-05: qty 1

## 2021-07-05 MED ORDER — SEVOFLURANE IN SOLN
RESPIRATORY_TRACT | Status: AC
  Filled 2021-07-05: qty 250

## 2021-07-05 MED ORDER — CEFAZOLIN SODIUM-DEXTROSE 2-4 GM/100ML-% IV SOLN
2.0000 g | INTRAVENOUS | Status: AC
Start: 2021-07-05 — End: 2021-07-05
  Administered 2021-07-05: 2 g via INTRAVENOUS

## 2021-07-05 MED ORDER — POVIDONE-IODINE 7.5 % EX SOLN
Freq: Once | CUTANEOUS | Status: DC
  Filled 2021-07-05: qty 118

## 2021-07-05 MED ORDER — FENTANYL CITRATE (PF) 100 MCG/2ML IJ SOLN: 25.0000 ug | INTRAMUSCULAR | Status: DC | PRN

## 2021-07-05 MED ORDER — SODIUM CHLORIDE 0.9 % IV SOLN: INTRAVENOUS | Status: DC

## 2021-07-05 MED ORDER — FENTANYL CITRATE (PF) 100 MCG/2ML IJ SOLN
INTRAMUSCULAR | Status: DC | PRN
  Administered 2021-07-05 (×2): 50 ug via INTRAVENOUS

## 2021-07-05 MED ORDER — PHENYLEPHRINE HCL (PRESSORS) 10 MG/ML IV SOLN
INTRAVENOUS | Status: DC | PRN
  Administered 2021-07-05 (×5): 100 ug via INTRAVENOUS

## 2021-07-05 MED ORDER — DEXAMETHASONE SODIUM PHOSPHATE 10 MG/ML IJ SOLN
INTRAMUSCULAR | Status: DC | PRN
  Administered 2021-07-05: 5 mg via INTRAVENOUS

## 2021-07-05 MED ORDER — FENTANYL CITRATE (PF) 100 MCG/2ML IJ SOLN
INTRAMUSCULAR | Status: AC
  Filled 2021-07-05: qty 2

## 2021-07-05 MED ORDER — ONDANSETRON HCL 4 MG/2ML IJ SOLN
INTRAMUSCULAR | Status: DC | PRN
  Administered 2021-07-05: 4 mg via INTRAVENOUS

## 2021-07-05 MED ORDER — PROPOFOL 10 MG/ML IV BOLUS
INTRAVENOUS | Status: DC | PRN
  Administered 2021-07-05: 160 mg via INTRAVENOUS

## 2021-07-05 MED ORDER — BUPIVACAINE HCL (PF) 0.5 % IJ SOLN
INTRAMUSCULAR | Status: AC
  Filled 2021-07-05: qty 30

## 2021-07-05 MED ORDER — PROPOFOL 10 MG/ML IV BOLUS
INTRAVENOUS | Status: AC
  Filled 2021-07-05: qty 20

## 2021-07-05 SURGICAL SUPPLY — 77 items
2.8 Guide Pin ×6 IMPLANT
ANCH SUT 2 2.9 2 LD TPR NDL (Anchor) ×2 IMPLANT
ANCHOR JUGGERKNOT WTAP NDL 2.9 (Anchor) ×3 IMPLANT
BIT DRILL 4.8X200 CANN (BIT) ×3 IMPLANT
BIT DRILL JUGRKNT W/NDL BIT2.9 (DRILL) ×2 IMPLANT
BIT DRILL STRM CALB 2.0ST W/SL (BIT) ×2 IMPLANT
BLADE SURG 15 STRL LF DISP TIS (BLADE) ×4 IMPLANT
BLADE SURG 15 STRL SS (BLADE) ×6
BNDG CMPR STD VLCR NS LF 5.8X4 (GAUZE/BANDAGES/DRESSINGS) ×4
BNDG COHESIVE 4X5 TAN ST LF (GAUZE/BANDAGES/DRESSINGS) ×3 IMPLANT
BNDG CONFORM 2 STRL LF (GAUZE/BANDAGES/DRESSINGS) ×3 IMPLANT
BNDG CONFORM 3 STRL LF (GAUZE/BANDAGES/DRESSINGS) ×3 IMPLANT
BNDG ELASTIC 4X5.8 VLCR NS LF (GAUZE/BANDAGES/DRESSINGS) ×6 IMPLANT
BNDG ESMARK 4X12 TAN STRL LF (GAUZE/BANDAGES/DRESSINGS) ×3 IMPLANT
BNDG GAUZE ELAST 4 BULKY (GAUZE/BANDAGES/DRESSINGS) ×3 IMPLANT
BUR 4X45 EGG (BURR) ×3 IMPLANT
CANISTER SUCT 1200ML W/VALVE (MISCELLANEOUS) ×3 IMPLANT
COVER PIN YLW 0.028-062 (MISCELLANEOUS) ×6 IMPLANT
CUFF TOURN SGL QUICK 18X4 (TOURNIQUET CUFF) IMPLANT
CUFF TOURN SGL QUICK 24 (TOURNIQUET CUFF)
CUFF TRNQT CYL 24X4X16.5-23 (TOURNIQUET CUFF) IMPLANT
DRAPE C-ARM XRAY 36X54 (DRAPES) ×3 IMPLANT
DRAPE C-ARMOR (DRAPES) ×3 IMPLANT
DRAPE FLUOR MINI C-ARM 54X84 (DRAPES) ×3 IMPLANT
DRILL JUGGERKNOT W/NDL BIT 2.9 (DRILL) ×3
DRILL STRM CALIB 2.0 ST W/SL (BIT) ×3
DURAPREP 26ML APPLICATOR (WOUND CARE) ×3 IMPLANT
ELECT REM PT RETURN 9FT ADLT (ELECTROSURGICAL) ×3
ELECTRODE REM PT RTRN 9FT ADLT (ELECTROSURGICAL) ×2 IMPLANT
GAUZE 4X4 16PLY ~~LOC~~+RFID DBL (SPONGE) ×9 IMPLANT
GAUZE SPONGE 4X4 12PLY STRL (GAUZE/BANDAGES/DRESSINGS) ×3 IMPLANT
GAUZE XEROFORM 1X8 LF (GAUZE/BANDAGES/DRESSINGS) ×3 IMPLANT
GLOVE SURG ENC MOIS LTX SZ7.5 (GLOVE) ×3 IMPLANT
GLOVE SURG UNDER LTX SZ8 (GLOVE) ×3 IMPLANT
GOWN STRL REUS W/ TWL XL LVL3 (GOWN DISPOSABLE) ×4 IMPLANT
GOWN STRL REUS W/TWL XL LVL3 (GOWN DISPOSABLE) ×6
GUIDE PIN DRILL TIP 2.8X450HIP (PIN) ×6
KIT STRATUM INSTRUMENT STD (KITS) ×3 IMPLANT
KIT TURNOVER KIT A (KITS) ×3 IMPLANT
MANIFOLD NEPTUNE II (INSTRUMENTS) ×3 IMPLANT
NEEDLE FILTER BLUNT 18X 1/2SAF (NEEDLE) ×1
NEEDLE FILTER BLUNT 18X1 1/2 (NEEDLE) ×2 IMPLANT
NEEDLE HYPO 25X1 1.5 SAFETY (NEEDLE) ×6 IMPLANT
NS IRRIG 1000ML POUR BTL (IV SOLUTION) ×3 IMPLANT
PACK EXTREMITY ARMC (MISCELLANEOUS) ×3 IMPLANT
PADDING CAST BLEND 4X4 NS (MISCELLANEOUS) ×3 IMPLANT
PIN GUIDE DRILL TIP 2.8X450HIP (PIN) ×4 IMPLANT
PLATE BN SM FT RT MED CLMN FSN (Plate) ×2 IMPLANT
PLATE MCF STRM SM RT (Plate) ×3 IMPLANT
PUTTY DBX 5CC (Putty) ×3 IMPLANT
RASP SM TEAR CROSS CUT (RASP) ×3 IMPLANT
SCREW 16MM THREAD 6.5X75MM (Screw) ×3 IMPLANT
SCREW CANN THRD 6.5X70 (Screw) ×3 IMPLANT
SCREW LOCK BN 20X2.7 STRL FT (Screw) ×2 IMPLANT
SCREW LOCK ST STRM 2.7X10 (Screw) ×6 IMPLANT
SCREW LOCK ST STRM 2.7X14 (Screw) ×3 IMPLANT
SCREW LOCK ST STRM 2.7X16 (Screw) ×3 IMPLANT
SCREW NL LP ST STRM 2.7X16 (Screw) ×3 IMPLANT
SCREW STRM LOCK 2.7X20 ST (Screw) ×3 IMPLANT
SPLINT CAST 1 STEP 4X30 (MISCELLANEOUS) ×3 IMPLANT
SPLINT FAST PLASTER 5X30 (CAST SUPPLIES) ×1
SPLINT PLASTER CAST FAST 5X30 (CAST SUPPLIES) ×2 IMPLANT
STOCKINETTE M/LG 89821 (MISCELLANEOUS) ×3 IMPLANT
STRIP CLOSURE SKIN 1/4X4 (GAUZE/BANDAGES/DRESSINGS) ×3 IMPLANT
SUT ETHILON 3-0 FS-10 30 BLK (SUTURE) ×6
SUT ETHILON 4-0 (SUTURE) ×3
SUT ETHILON 4-0 FS2 18XMFL BLK (SUTURE) ×2
SUT VIC AB 2-0 SH 27 (SUTURE) ×3
SUT VIC AB 2-0 SH 27XBRD (SUTURE) ×2 IMPLANT
SUT VIC AB 3-0 SH 27 (SUTURE) ×6
SUT VIC AB 3-0 SH 27X BRD (SUTURE) ×4 IMPLANT
SUT VIC AB 4-0 FS2 27 (SUTURE) ×3 IMPLANT
SUTURE EHLN 3-0 FS-10 30 BLK (SUTURE) ×4 IMPLANT
SUTURE ETHLN 4-0 FS2 18XMF BLK (SUTURE) ×2 IMPLANT
SYR 10ML LL (SYRINGE) ×3 IMPLANT
TOWEL OR 17X26 4PK STRL BLUE (TOWEL DISPOSABLE) ×3 IMPLANT
WIRE Z .062 C-WIRE SPADE TIP (WIRE) ×12 IMPLANT

## 2021-07-05 NOTE — TOC Initial Note (Signed)
Transition of Care [facility]) - Initial/Assessment Note    Patient Details  Name: Carrie Burns MRN: [id number] Date of Birth: 14-Dec-1950  [facility] of [facility] CM/SW Contact:    Marina Goodell Phone Number: [phone] 07/05/2021, 12:44 PM  Clinical Narrative:                  Patient d/c home today with walker.  Delivered by [facility].       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 [place] [address] Phone: [phone] Fax: [phone]     Social Determinants of Health (SDOH) Interventions    Readmission Risk Interventions No flowsheet data found.

## 2021-07-05 NOTE — Transfer of Care (Signed)
Immediate Anesthesia Transfer of Care Note  Patient: Carrie Burns  Procedure(s) Performed: ARTHRODESIS; STJ (Right: Foot) ARTHRODESIS; LISFRANC, MULTIPLE (Right: Toe)  Patient Location: PACU  Anesthesia Type:General  Level of Consciousness: awake, alert  and oriented  Airway & Oxygen Therapy: Patient Spontanous Breathing and Patient connected to face mask oxygen  Post-op Assessment: Report given to RN and Post -op Vital signs reviewed and stable  Post vital signs: Reviewed and stable  Last Vitals:  Vitals Value Taken Time  BP 107/61 07/05/21 1109  Temp    Pulse 73 07/05/21 1112  Resp 13 07/05/21 1112  SpO2 100 % 07/05/21 1112  Vitals shown include unvalidated device data.  Last Pain:  Vitals:   07/05/21 0609  TempSrc: Oral  PainSc: 0-No pain         Complications: No notable events documented.

## 2021-07-05 NOTE — Op Note (Signed)
Operative note   Surgeon:Maxamus Colao Lawyer: None    Preop diagnosis: 1.  Osteoarthritis right medial column 2.  Subtalar joint valgus with osteoarthritis 3.  Posterior tibial dysfunction right foot    Postop diagnosis: Same    Procedure: 1.  Subtalar joint arthrodesis right foot 2.  Arthrodesis navicular-cuneiform and cuneiform first metatarsal right medial column    EBL: Minimal    Anesthesia:local and general local consisted of a total of 20 cc of 0.5% bupivacaine and 20 cc of Exparel long-acting anesthetic    Hemostasis: Mid calf tourniquet inflated 200 mmHg for 120 minutes    Specimen: None    Complications: None    Operative indications:EDIT Carrie Burns is an 70 y.o. that presents today for surgical intervention.  The risks/benefits/alternatives/complications have been discussed and consent has been given.    Procedure:  Patient was brought into the OR and placed on the operating table in thesupine position. After anesthesia was obtained theright lower extremity was prepped and draped in usual sterile fashion.  Attention was initially directed to the lateral aspect of the foot along the subtalar joint region.  A lateral incision was made from the distal tip of the fibula to the calcaneocuboid joint.  Sharp and blunt dissection carried down to the deep fascia.  The adipose tissue was noted and reflected dorsally.  The extensor digitorum brevis was reflected distally.  This exposed the subtalar joint.  All of the articular cartilage was then removed.  The joint was then prepped with a bur and a 2.0 mm drill bit.  The area was infiltrated with approximately 2 and half cc of bone putty.  Next 2 large 6.5 millimeter screws were placed from the posterior aspect of the calcaneus crossing the subtalar joint into the body of the talus.  Good compression and stability was noted.  The calcaneal was placed in a neutral position.  The wound was flushed with copious amounts of irrigation.   Closure was then performed with 3-0 Vicryl for the deeper and subcutaneous tissues and a 3-0 nylon for skin.  The posterior heel incisions were closed with nylon.  Attention was then directed medially where a medial incision was made from the level of the talonavicular joint to the level of the base of the first metatarsal.  Sharp and blunt dissection carried down to the deep fascial layer.  The anterior tibial tendon was noted and reflected throughout the entire procedure.  The first met cuneiform and navicular cuneiform joints were then exposed at this time.  There was noted to be plantar prominence of the medial cuneiform with exostosis in the area.  This was recontoured with a rasp.  This was then smoothed down to a much more normal position.  The joints was then prepared.  All articular cartilage was removed and these were prepared with a 2.0 mm drill bit down to healthy bleeding tissue.  Each joint was then infiltrated with approximately 1 cc of bone putty into each joint.  The wound was then flushed with copious amounts of irrigation.  At this time a stratum foot medial column compression plate was initially placed to the medial aspect of the foot.  The most proximal screw holes were filled after the plate was then placed along the medial navicular.  The tines were noted to be into the navicular and stable.  The most proximal screws were then placed these were locking screws.  The compression ramp was applied and compression across the navicular  cuneiform joint occurred.  2 locking screws were placed into the medial cuneiform.  A compression wrap was then applied to the first metatarsal base with good compression noted.  A nonlocking distal 2.7 screw and a locking 2.7 screw were then placed distal to the MTPJ.  Good alignment and stability was noted.  The foot was placed prior to fusion in a neutral position with better realignment of the medial column and arch region.  All wounds were flushed with copious  amounts of irrigation.  The anterior tibial tendon was reapproximated to the base of the first metatarsal and medial navicular with a 2.9 mm juggernaut.  Good stability was noted.  The foot was held in a neutral position at this time.  Layered closure was performed with a 2-0 and 3-0 Vicryl for the deeper and subcutaneous tissue and a 3-0 nylon for skin.  A bulky sterile dressing was applied patient was placed in a neutral position and into an equalizer walker boot.    Patient tolerated the procedure and anesthesia well.  Was transported from the OR to the PACU with all vital signs stable and vascular status intact. To be discharged per routine protocol.  Will follow up in approximately 1 week in the outpatient clinic.

## 2021-07-05 NOTE — Discharge Instructions (Addendum)
[facility]  POST OPERATIVE INSTRUCTIONS FOR DR. FOWLER AND [facility] POD[facility]PART[facility]   Take your medication as prescribed.  Pain medication should be taken only as needed.  Keep the dressing clean, dry and intact.  Keep your foot elevated above the heart level for the first 48 hours.  We have instructed you to be non-weight bearing.  Always wear your post-op shoe when walking.  Always use your crutches if you are to be non-weight bearing.  Do not take a shower. Baths are permissible as long as the foot is kept out of the water.   Every hour you are awake:  Bend your knee 15 times.  Call [facility] ([phone]) if any of the following problems occur: You develop a temperature or fever. The bandage becomes saturated with blood. Medication does not stop your pain. Injury of the foot occurs. Any symptoms of infection including redness, odor, or red streaks running from wound.        AMBULATORY SURGERY  DISCHARGE INSTRUCTIONS   The drugs that you were given will stay in your system until tomorrow so for the next 24 hours you should not:  Drive an automobile Make any legal decisions Drink any alcoholic beverage   You may resume regular meals tomorrow.  Today it is better to start with liquids and gradually work up to solid foods.  You may eat anything you prefer, but it is better to start with liquids, then soup and crackers, and gradually work up to solid foods.   Please notify your doctor immediately if you have any unusual bleeding, trouble breathing, redness and pain at the surgery site, drainage, fever, or pain not relieved by medication.     Your post-operative visit with Dr.                                       is: Date:                        Time:    Please call to schedule your post-operative visit.  Additional Instructions:  

## 2021-07-05 NOTE — Anesthesia Preprocedure Evaluation (Signed)
Anesthesia Evaluation  Patient identified by MRN, date of birth, ID band Patient awake    Reviewed: Allergy & Precautions, NPO status , Patient's Chart, lab work & pertinent test results, reviewed documented beta blocker date and time   History of Anesthesia Complications Negative for: history of anesthetic complications  Airway Mallampati: II  TM Distance: >3 FB Neck ROM: Full    Dental no notable dental hx.    Pulmonary neg pulmonary ROS, neg shortness of breath, neg sleep apnea, neg COPD, neg recent URI,    breath sounds clear to auscultation- rhonchi (-) wheezing      Cardiovascular Exercise Tolerance: Good hypertension, Pt. on medications and Pt. on home beta blockers (-) CAD, (-) Past MI, (-) Cardiac Stents and (-) CABG  Rhythm:Regular Rate:Normal - Systolic murmurs and - Diastolic murmurs    Neuro/Psych negative neurological ROS  negative psych ROS   GI/Hepatic Neg liver ROS, GERD  Medicated and Controlled,  Endo/Other  diabetes, Insulin Dependent  Renal/GU negative Renal ROS  negative genitourinary   Musculoskeletal negative musculoskeletal ROS (+)   Abdominal (+) + obese,   Peds negative pediatric ROS (+)  Hematology negative hematology ROS (+)   Anesthesia Other Findings Past Medical History: No date: Diabetes mellitus without complication (HCC)   Reproductive/Obstetrics                             Anesthesia Physical  Anesthesia Plan  ASA: 2  Anesthesia Plan: General   Post-op Pain Management:    Induction: Intravenous  PONV Risk Score and Plan: 2 and Ondansetron, Dexamethasone and Treatment may vary due to age or medical condition  Airway Management Planned: LMA and Oral ETT  Additional Equipment:   Intra-op Plan:   Post-operative Plan: Extubation in OR  Informed Consent: I have reviewed the patients History and Physical, chart, labs and discussed the  procedure including the risks, benefits and alternatives for the proposed anesthesia with the patient or authorized representative who has indicated his/her understanding and acceptance.     Dental advisory given  Plan Discussed with: CRNA and Anesthesiologist  Anesthesia Plan Comments:         Anesthesia Quick Evaluation

## 2021-07-05 NOTE — Anesthesia Procedure Notes (Signed)
Procedure Name: LMA Insertion Date/Time: 07/05/2021 7:37 AM Performed by: Hermenia Bers, CRNA Pre-anesthesia Checklist: Patient identified, Patient being monitored, Timeout performed, Emergency Drugs available and Suction available Patient Re-evaluated:Patient Re-evaluated prior to induction Oxygen Delivery Method: Circle system utilized Preoxygenation: Pre-oxygenation with 100% oxygen Induction Type: IV induction Ventilation: Mask ventilation without difficulty LMA: LMA inserted LMA Size: 5.0 Tube type: Oral Number of attempts: 1 Placement Confirmation: positive ETCO2 and breath sounds checked- equal and bilateral Tube secured with: Tape Dental Injury: Teeth and Oropharynx as per pre-operative assessment

## 2021-07-05 NOTE — H&P (Signed)
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  The various methods of treatment have been discussed with the patient.  No guarantees were given.  After consideration of risks, benefits and other options for treatment, the patient has consented to surgery.  I have reviewed the patients' chart and labs.     A history and physical examination was performed in my office.  The patient was reexamined.  There have been no changes to this history and physical examination.  [name] A

## 2021-07-06 NOTE — Anesthesia Postprocedure Evaluation (Signed)
Anesthesia Post Note  Patient: Carrie Burns  Procedure(s) Performed: ARTHRODESIS; STJ (Right: Foot) ARTHRODESIS; LISFRANC, MULTIPLE (Right: Toe)  Patient location during evaluation: PACU Anesthesia Type: General Level of consciousness: lethargic Pain management: pain level controlled Vital Signs Assessment: post-procedure vital signs reviewed and stable Respiratory status: spontaneous breathing, nonlabored ventilation, respiratory function stable and patient connected to nasal cannula oxygen Cardiovascular status: blood pressure returned to baseline and stable Postop Assessment: no apparent nausea or vomiting Anesthetic complications: no   No notable events documented.   Last Vitals:  Vitals:   07/05/21 1204 07/05/21 1316  BP: 110/64 102/66  Pulse: 72 68  Resp: 14 16  Temp: (!) 36.2 C 36.6 C  SpO2: 94% 98%    Last Pain:  Vitals:   07/05/21 1316  TempSrc: Tympanic  PainSc:                  Lenard Simmer

## 2021-07-09 ENCOUNTER — Encounter: Payer: Self-pay | Admitting: Podiatry

## 2022-05-24 IMAGING — RF DG OS CALCIS 2+V*R*
1 series · 2 of 2 positions shown · non-contrast
Comparison: None.

CLINICAL DATA: Calcaneus surgery

EXAM:
RIGHT OS CALCIS - 2+ VIEW

[Series 1: unknown protocol · 0.14mm/px · 2 of 2 slices shown]
[im 1/2]
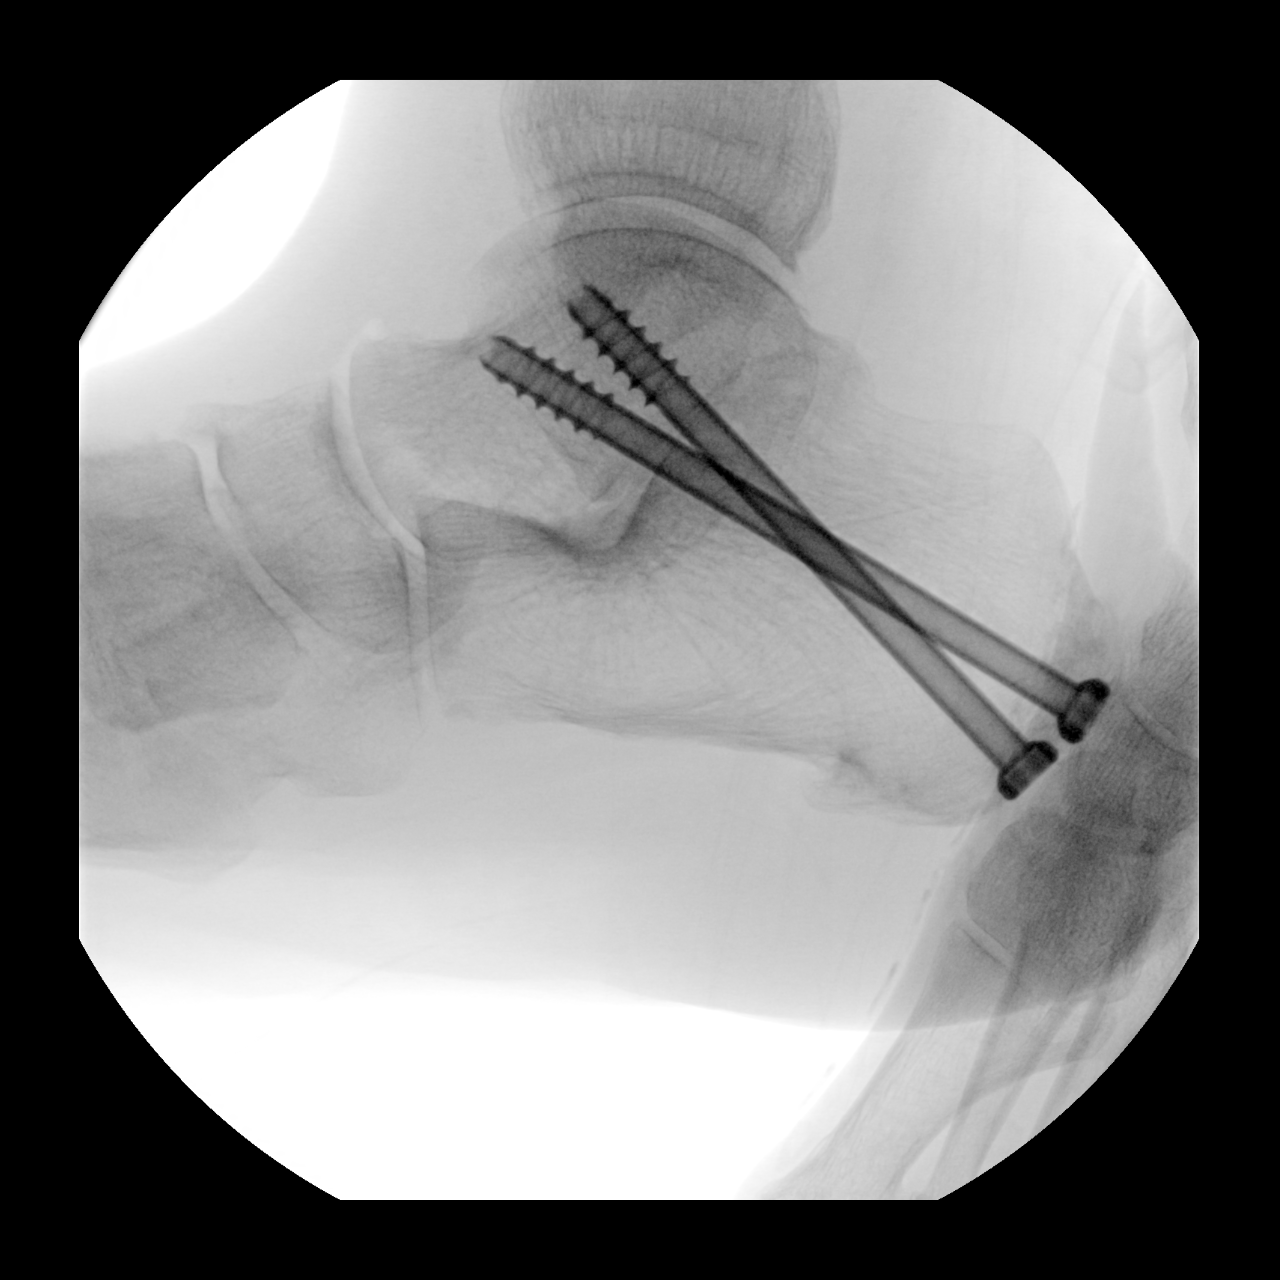
[im 2/2]
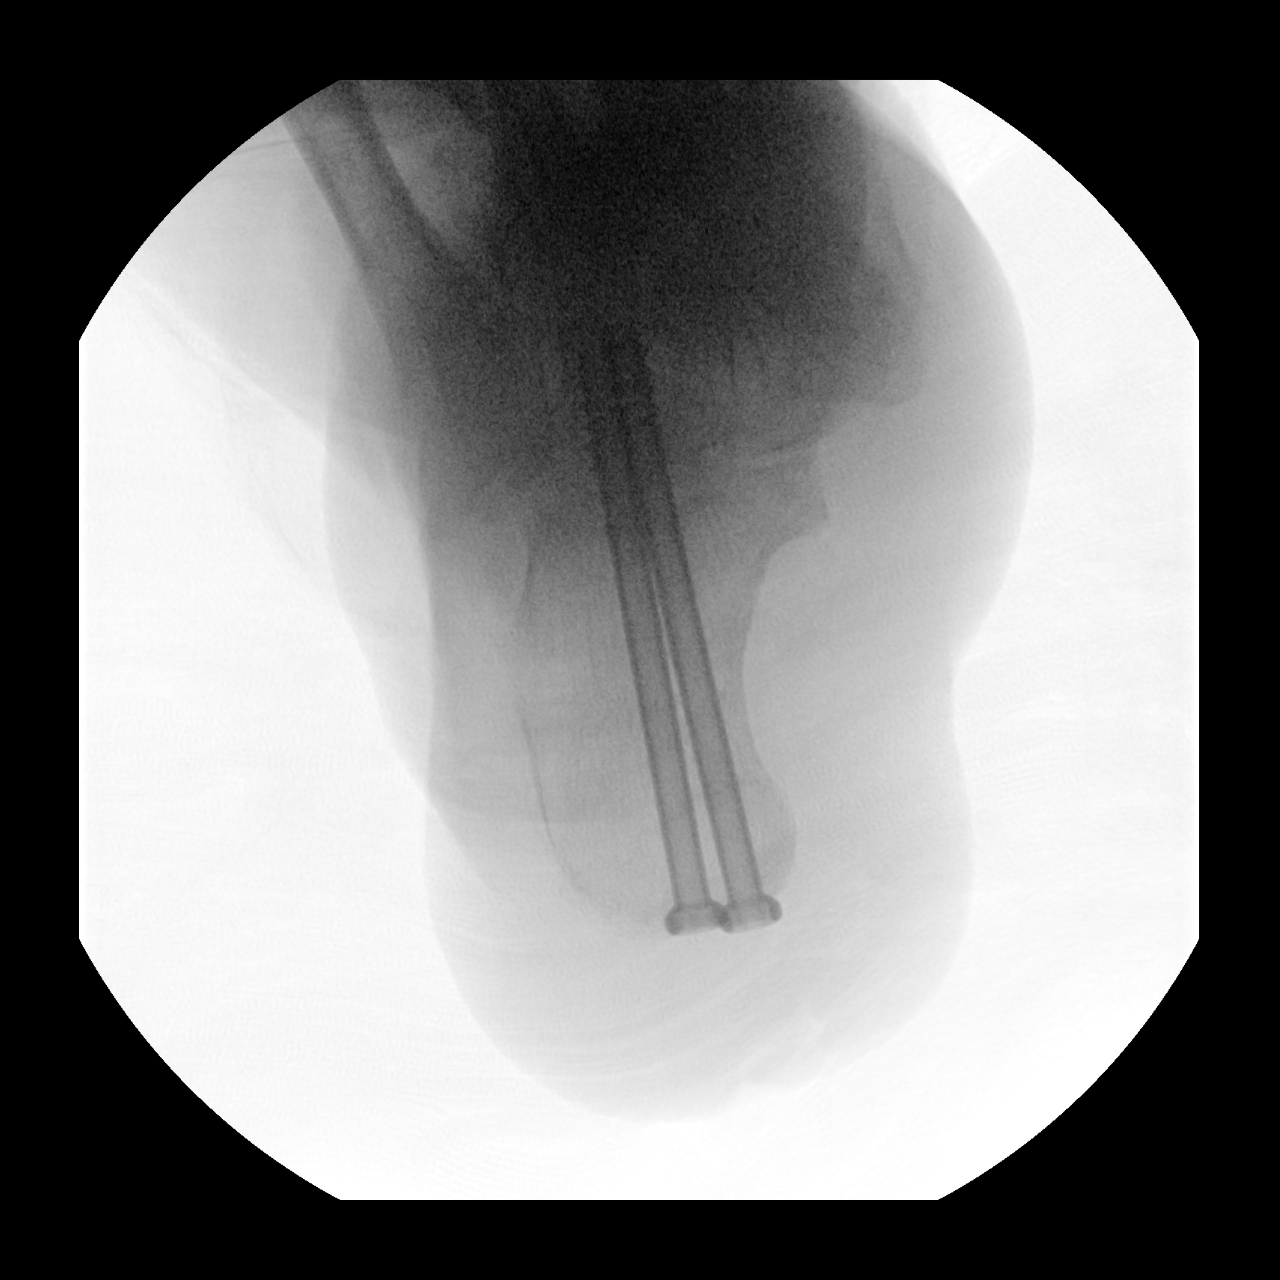

[2 of 2 positions shown; findings below may reference images not displayed]

FINDINGS: Multiple fluoroscopic images demonstrate 2 fusion screws for
subtalar arthrodesis. Intact hardware without evidence of
complication.
IMPRESSION: Subtalar arthrodesis without evidence of immediate complication.

## 2022-10-06 ENCOUNTER — Encounter (INDEPENDENT_AMBULATORY_CARE_PROVIDER_SITE_OTHER): Payer: Self-pay
